# Patient Record
Sex: Female | Born: 1975 | Race: White | Hispanic: No | Smoking: Never smoker
Health system: Southern US, Community
[De-identification: ages and names within clinical notes are randomized; demographics above are authoritative.]

## PROBLEM LIST (undated history)

## (undated) DIAGNOSIS — F429 Obsessive-compulsive disorder, unspecified: Secondary | ICD-10-CM

## (undated) DIAGNOSIS — F419 Anxiety disorder, unspecified: Secondary | ICD-10-CM

## (undated) DIAGNOSIS — F909 Attention-deficit hyperactivity disorder, unspecified type: Secondary | ICD-10-CM

## (undated) DIAGNOSIS — R519 Headache, unspecified: Secondary | ICD-10-CM

## (undated) DIAGNOSIS — F32A Depression, unspecified: Secondary | ICD-10-CM

## (undated) DIAGNOSIS — F411 Generalized anxiety disorder: Secondary | ICD-10-CM

## (undated) DIAGNOSIS — F329 Major depressive disorder, single episode, unspecified: Secondary | ICD-10-CM

## (undated) DIAGNOSIS — N3010 Interstitial cystitis (chronic) without hematuria: Secondary | ICD-10-CM

## (undated) DIAGNOSIS — R51 Headache: Secondary | ICD-10-CM

## (undated) DIAGNOSIS — E559 Vitamin D deficiency, unspecified: Secondary | ICD-10-CM

## (undated) HISTORY — DX: Headache, unspecified: R51.9

## (undated) HISTORY — DX: Headache: R51

## (undated) HISTORY — DX: Attention-deficit hyperactivity disorder, unspecified type: F90.9

## (undated) HISTORY — DX: Vitamin D deficiency, unspecified: E55.9

## (undated) HISTORY — DX: Generalized anxiety disorder: F41.1

---

## 2010-03-07 ENCOUNTER — Inpatient Hospital Stay (HOSPITAL_COMMUNITY): Admission: AD | Admit: 2010-03-07 | Discharge: 2010-03-08 | Payer: Self-pay | Admitting: Obstetrics and Gynecology

## 2010-12-18 LAB — RPR: RPR Ser Ql: NONREACTIVE

## 2010-12-18 LAB — CBC
HCT: 30.7 % — ABNORMAL LOW (ref 36.0–46.0)
MCHC: 35.2 g/dL (ref 30.0–36.0)
MCV: 97 fL (ref 78.0–100.0)
Platelets: 113 10*3/uL — ABNORMAL LOW (ref 150–400)
Platelets: 148 10*3/uL — ABNORMAL LOW (ref 150–400)
RBC: 3.46 MIL/uL — ABNORMAL LOW (ref 3.87–5.11)
RDW: 13.9 % (ref 11.5–15.5)

## 2011-10-18 ENCOUNTER — Encounter (HOSPITAL_COMMUNITY): Payer: Self-pay | Admitting: *Deleted

## 2011-10-18 ENCOUNTER — Inpatient Hospital Stay (HOSPITAL_COMMUNITY)
Admission: AD | Admit: 2011-10-18 | Discharge: 2011-10-22 | DRG: 885 | Disposition: A | Discharge status: Home / Self Care | Payer: PRIVATE HEALTH INSURANCE | Source: Ambulatory Visit | Attending: Psychiatry | Admitting: Psychiatry

## 2011-10-18 ENCOUNTER — Other Ambulatory Visit: Payer: Self-pay

## 2011-10-18 ENCOUNTER — Emergency Department (HOSPITAL_COMMUNITY)
Admission: EM | Admit: 2011-10-18 | Discharge: 2011-10-18 | Disposition: A | Discharge status: Other Acute Inpatient Hospital | Payer: 59 | Attending: Emergency Medicine | Admitting: Emergency Medicine

## 2011-10-18 ENCOUNTER — Encounter (HOSPITAL_COMMUNITY): Payer: Self-pay | Admitting: Emergency Medicine

## 2011-10-18 DIAGNOSIS — T424X4A Poisoning by benzodiazepines, undetermined, initial encounter: Secondary | ICD-10-CM

## 2011-10-18 DIAGNOSIS — F429 Obsessive-compulsive disorder, unspecified: Secondary | ICD-10-CM

## 2011-10-18 DIAGNOSIS — F319 Bipolar disorder, unspecified: Secondary | ICD-10-CM

## 2011-10-18 DIAGNOSIS — F411 Generalized anxiety disorder: Secondary | ICD-10-CM | POA: Insufficient documentation

## 2011-10-18 DIAGNOSIS — F419 Anxiety disorder, unspecified: Secondary | ICD-10-CM

## 2011-10-18 DIAGNOSIS — Z79899 Other long term (current) drug therapy: Secondary | ICD-10-CM | POA: Insufficient documentation

## 2011-10-18 DIAGNOSIS — F19939 Other psychoactive substance use, unspecified with withdrawal, unspecified: Secondary | ICD-10-CM

## 2011-10-18 DIAGNOSIS — T43502A Poisoning by unspecified antipsychotics and neuroleptics, intentional self-harm, initial encounter: Secondary | ICD-10-CM

## 2011-10-18 DIAGNOSIS — F603 Borderline personality disorder: Secondary | ICD-10-CM

## 2011-10-18 DIAGNOSIS — T1491XA Suicide attempt, initial encounter: Secondary | ICD-10-CM

## 2011-10-18 DIAGNOSIS — X789XXA Intentional self-harm by unspecified sharp object, initial encounter: Secondary | ICD-10-CM | POA: Insufficient documentation

## 2011-10-18 DIAGNOSIS — T438X2A Poisoning by other psychotropic drugs, intentional self-harm, initial encounter: Secondary | ICD-10-CM

## 2011-10-18 DIAGNOSIS — F329 Major depressive disorder, single episode, unspecified: Principal | ICD-10-CM

## 2011-10-18 DIAGNOSIS — F332 Major depressive disorder, recurrent severe without psychotic features: Secondary | ICD-10-CM | POA: Insufficient documentation

## 2011-10-18 DIAGNOSIS — F1320 Sedative, hypnotic or anxiolytic dependence, uncomplicated: Secondary | ICD-10-CM

## 2011-10-18 DIAGNOSIS — Z880 Allergy status to penicillin: Secondary | ICD-10-CM

## 2011-10-18 DIAGNOSIS — T43294A Poisoning by other antidepressants, undetermined, initial encounter: Secondary | ICD-10-CM | POA: Insufficient documentation

## 2011-10-18 DIAGNOSIS — S61509A Unspecified open wound of unspecified wrist, initial encounter: Secondary | ICD-10-CM | POA: Insufficient documentation

## 2011-10-18 HISTORY — DX: Depression, unspecified: F32.A

## 2011-10-18 HISTORY — DX: Anxiety disorder, unspecified: F41.9

## 2011-10-18 HISTORY — DX: Major depressive disorder, single episode, unspecified: F32.9

## 2011-10-18 LAB — URINALYSIS, ROUTINE W REFLEX MICROSCOPIC
Glucose, UA: NEGATIVE mg/dL
Hgb urine dipstick: NEGATIVE
Specific Gravity, Urine: 1.012 (ref 1.005–1.030)
pH: 7.5 (ref 5.0–8.0)

## 2011-10-18 LAB — BASIC METABOLIC PANEL
GFR calc Af Amer: >90 mL/min (ref >90)
GFR calc non Af Amer: 85 mL/min — ABNORMAL LOW (ref >90)
Glucose, Bld: 72 mg/dL (ref 70–99)
Potassium: 3 mEq/L — ABNORMAL LOW (ref 3.5–5.1)
Sodium: 139 mEq/L (ref 135–145)

## 2011-10-18 LAB — SALICYLATE LEVEL: Salicylate Lvl: <2 mg/dL — ABNORMAL LOW (ref 2.8–20.0)

## 2011-10-18 LAB — URINE MICROSCOPIC-ADD ON

## 2011-10-18 LAB — URINE CULTURE

## 2011-10-18 LAB — RAPID URINE DRUG SCREEN, HOSP PERFORMED
Cocaine: NOT DETECTED
Opiates: NOT DETECTED
Tetrahydrocannabinol: NOT DETECTED

## 2011-10-18 LAB — ETHANOL: Alcohol, Ethyl (B): <11 mg/dL (ref 0–11)

## 2011-10-18 LAB — CBC
Hemoglobin: 13.9 g/dL (ref 12.0–15.0)
MCHC: 34.8 g/dL (ref 30.0–36.0)
Platelets: 261 10*3/uL (ref 150–400)

## 2011-10-18 LAB — ACETAMINOPHEN LEVEL: Acetaminophen (Tylenol), Serum: <15 ug/mL (ref 10–30)

## 2011-10-18 MED ORDER — MAGNESIUM HYDROXIDE 400 MG/5ML PO SUSP: 30.0000 mL | Freq: Every day | ORAL | Status: DC | PRN

## 2011-10-18 MED ORDER — TRAZODONE HCL 50 MG PO TABS
50.0000 mg | ORAL_TABLET | Freq: Every evening | ORAL | Status: DC | PRN
  Administered 2011-10-19: 50 mg via ORAL
  Filled 2011-10-18: qty 1

## 2011-10-18 MED ORDER — ZOLPIDEM TARTRATE 5 MG PO TABS: 5.0000 mg | ORAL_TABLET | Freq: Every evening | ORAL | Status: DC | PRN

## 2011-10-18 MED ORDER — HYDROXYZINE HCL 25 MG PO TABS: 25.0000 mg | ORAL_TABLET | Freq: Four times a day (QID) | ORAL | Status: DC | PRN

## 2011-10-18 MED ORDER — ALUM & MAG HYDROXIDE-SIMETH 200-200-20 MG/5ML PO SUSP: 30.0000 mL | ORAL | Status: DC | PRN

## 2011-10-18 MED ORDER — VITAMIN B-1 100 MG PO TABS
100.0000 mg | ORAL_TABLET | Freq: Every day | ORAL | Status: DC
Start: 2011-10-19 — End: 2011-10-22
  Administered 2011-10-19 – 2011-10-22 (×4): 100 mg via ORAL
  Filled 2011-10-18 (×5): qty 1

## 2011-10-18 MED ORDER — LOPERAMIDE HCL 2 MG PO CAPS: 2.0000 mg | ORAL_CAPSULE | ORAL | Status: AC | PRN

## 2011-10-18 MED ORDER — POTASSIUM CHLORIDE CRYS ER 20 MEQ PO TBCR
40.0000 meq | EXTENDED_RELEASE_TABLET | Freq: Once | ORAL | Status: AC
  Administered 2011-10-18: 40 meq via ORAL
  Filled 2011-10-18: qty 2

## 2011-10-18 MED ORDER — TETANUS-DIPHTH-ACELL PERTUSSIS 5-2.5-18.5 LF-MCG/0.5 IM SUSP
0.5000 mL | Freq: Once | INTRAMUSCULAR | Status: AC
  Administered 2011-10-18: 0.5 mL via INTRAMUSCULAR
  Filled 2011-10-18: qty 0.5

## 2011-10-18 MED ORDER — THIAMINE HCL 100 MG/ML IJ SOLN: 100.0000 mg | Freq: Once | INTRAMUSCULAR | Status: DC

## 2011-10-18 MED ORDER — ONDANSETRON HCL 4 MG PO TABS: 4.0000 mg | ORAL_TABLET | Freq: Three times a day (TID) | ORAL | Status: DC | PRN

## 2011-10-18 MED ORDER — SODIUM CHLORIDE 0.9 % IV BOLUS (SEPSIS)
1000.0000 mL | Freq: Once | INTRAVENOUS | Status: AC
  Administered 2011-10-18: 1000 mL via INTRAVENOUS

## 2011-10-18 MED ORDER — DESVENLAFAXINE SUCCINATE ER 50 MG PO TB24: 50.0000 mg | ORAL_TABLET | Freq: Every day | ORAL | Status: DC

## 2011-10-18 MED ORDER — ONDANSETRON 4 MG PO TBDP: 4.0000 mg | ORAL_TABLET | Freq: Four times a day (QID) | ORAL | Status: AC | PRN

## 2011-10-18 MED ORDER — CLONAZEPAM 1 MG PO TABS
1.0000 mg | ORAL_TABLET | Freq: Four times a day (QID) | ORAL | Status: DC
  Administered 2011-10-18: 1 mg via ORAL
  Filled 2011-10-18: qty 1

## 2011-10-18 MED ORDER — CHLORDIAZEPOXIDE HCL 25 MG PO CAPS
25.0000 mg | ORAL_CAPSULE | Freq: Four times a day (QID) | ORAL | Status: DC | PRN
  Filled 2011-10-18: qty 2

## 2011-10-18 MED ORDER — LORAZEPAM 1 MG PO TABS: 1.0000 mg | ORAL_TABLET | Freq: Three times a day (TID) | ORAL | Status: DC | PRN

## 2011-10-18 MED ORDER — CHLORDIAZEPOXIDE HCL 25 MG PO CAPS: 25.0000 mg | ORAL_CAPSULE | Freq: Three times a day (TID) | ORAL | Status: DC

## 2011-10-18 MED ORDER — CHLORDIAZEPOXIDE HCL 25 MG PO CAPS
25.0000 mg | ORAL_CAPSULE | Freq: Four times a day (QID) | ORAL | Status: DC
  Administered 2011-10-18: 25 mg via ORAL
  Filled 2011-10-18: qty 1

## 2011-10-18 MED ORDER — ACETAMINOPHEN 325 MG PO TABS: 650.0000 mg | ORAL_TABLET | Freq: Four times a day (QID) | ORAL | Status: DC | PRN

## 2011-10-18 MED ORDER — CHLORDIAZEPOXIDE HCL 25 MG PO CAPS: 25.0000 mg | ORAL_CAPSULE | Freq: Every day | ORAL | Status: DC

## 2011-10-18 MED ORDER — ADULT MULTIVITAMIN W/MINERALS CH
1.0000 | ORAL_TABLET | Freq: Every day | ORAL | Status: DC
Start: 2011-10-19 — End: 2011-10-22
  Administered 2011-10-19 – 2011-10-22 (×4): 1 via ORAL
  Filled 2011-10-18 (×4): qty 1

## 2011-10-18 MED ORDER — CHLORDIAZEPOXIDE HCL 25 MG PO CAPS: 25.0000 mg | ORAL_CAPSULE | ORAL | Status: DC

## 2011-10-18 MED ORDER — CHLORDIAZEPOXIDE HCL 25 MG PO CAPS
25.0000 mg | ORAL_CAPSULE | Freq: Once | ORAL | Status: AC
  Administered 2011-10-18: 25 mg via ORAL

## 2011-10-18 MED ORDER — IBUPROFEN 600 MG PO TABS: 600.0000 mg | ORAL_TABLET | Freq: Three times a day (TID) | ORAL | Status: DC | PRN

## 2011-10-18 MED ORDER — TRAZODONE HCL 50 MG PO TABS: 50.0000 mg | ORAL_TABLET | Freq: Every day | ORAL | Status: DC

## 2011-10-18 NOTE — BH Assessment (Addendum)
Assessment Note   Diana Goodman is an 36 y.o. female. Patient with hx of depression and anxiety reports increased depression over the past 5 days. States she is sleeping a lot;up to 12 hours per night. She feels very lonely, worses at night after her spouse leaves for work. She is also very anxious sts that sometimes "it's a unbearable feeling". States psychiatrist Dr Evelene Croon has changed her medications from zoloft after taking this medication for 10 yrs to Wellbutrin. She was later placed on Ritalin. She has not been able to fill the wellbutrin. Ritalin was added because she has been sleeping so much.   States that last night at 1:00am, she took the rest of her klonopin (estimated between 30-80 pills) because she "wanted to get loose enough where I could cut my wrist," left her husband a note, got in her car, and attempted to cut her wrist with a butcher knife. Denies taking any other pills. When asked about whether she still feels like killing herself she states "not right now, I might tomorrow." Patient denies current suicidal ideations and is requesting to go home so thay she may spend time with her kids.States that physically she feels fine but sleepy. Patient asking to be re-assessed later so that she may sleep and get rest. Patient makes the point that she has not felt her self since having her daughter approx. 18 months ago. Pt explains that the trigger for her depression and anxiety is related to a hormonal issues that formed after childbirth.   Denies HI, ETOH or drug abuse.   Pt sts that she does not want in-pt treatment and is requesting to go home so that she may be with her children. Pt agreeable to completing a telepsych with the understanding that this will assist with dispositions and providing her with the most appropriate care needed.    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 Depression   . Anxiety    Axis IV: other psychosocial or environmental problems Axis V: 31-40 impairment in reality testing  Past Medical History:  Past Medical History  Diagnosis Date  . Depression   . Anxiety     History reviewed. No pertinent past surgical history.  Family History: History reviewed. No pertinent family history.  Social History:  does not have a smoking history on file. She does not have any smokeless tobacco history on file. Her alcohol and drug histories not on file.  Additional Social History:  Alcohol / Drug Use Pain Medications: none reported Prescriptions: Prozac, Welbutrin, Klonopin, Ritalin.  Over the Counter: none reported History of alcohol / drug use?: No history of alcohol / drug abuse Longest period of sobriety (when/how long): n/a Allergies:  Allergies  Allergen Reactions  . Penicillins     Home Medications:  Medications Prior to Admission  Medication Dose Route Frequency Provider Last Rate Last Dose  . alum & mag hydroxide-simeth (MAALOX/MYLANTA) 200-200-20 MG/5ML suspension 30 mL  30 mL Oral PRN Rise Patience, PA      . ibuprofen (ADVIL,MOTRIN) tablet 600 mg  600 mg Oral Q8H PRN Rise Patience, PA      . LORazepam (ATIVAN) tablet 1 mg  1 mg Oral Q8H PRN Rise Patience, PA      . ondansetron [facility]) tablet 4 mg  4 mg Oral Q8H PRN Rise Patience, PA      .  potassium chloride SA (K-DUR,KLOR-CON) CR tablet 40 mEq  40 mEq Oral Once Rise Patience, PA   40 mEq at 10/18/11 0855  . sodium chloride 0.9 % bolus 1,000 mL  1,000 mL Intravenous Once Sunnie Nielsen, MD   1,000 mL at 10/18/11 0400  . TDaP (BOOSTRIX) injection 0.5 mL  0.5 mL Intramuscular Once Rise Patience, PA   0.5 mL at 10/18/11 0856  . zolpidem (AMBIEN) tablet 5 mg  5 mg Oral QHS PRN Rise Patience, PA       No current outpatient prescriptions on file as of 10/18/2011.    OB/GYN Status:  No LMP recorded.  General Assessment Data Location of Assessment: [facility] ACT  Assessment:  (no) Living Arrangements:  (lives with spouse and 2 children) Can pt return to current living arrangement?: Yes Admission Status: Voluntary Is patient capable of signing voluntary admission?: Yes Transfer from: Acute Hospital Referral Source: Self/Family/Friend     Risk to self Suicidal Ideation: Yes-Currently Present Suicidal Intent: No Is patient at risk for suicide?: Yes Suicidal Plan?: No (no current plan;sts she OD'd taking 30-80pills last night) Access to Means: Yes Specify Access to Suicidal Means:  (prescription medications) What has been your use of drugs/alcohol within the last 12 months?:  (pt denies alcohol and drug use) Previous Attempts/Gestures: No How many times?:  (n/a;pt denies previous suicide attempts and/or gestures) Other Self Harm Risks:  (n/a) Triggers for Past Attempts: Other (Comment) (no previous attempts) Intentional Self Injurious Behavior: None Family Suicide History:  ("mother and father both have issues with depression") Recent stressful life event(s): Other (Comment) (caring for daughter sts,"she is a handful & I can't rest") Persecutory voices/beliefs?: No Depression: Yes Depression Symptoms: Despondent;Tearfulness;Insomnia;Isolating;Fatigue;Loss of interest in usual pleasures;Feeling worthless/self pity;Feeling angry/irritable Substance abuse history and/or treatment for substance abuse?: Yes Suicide prevention information given to non-admitted patients: Yes  Risk to Others Homicidal Ideation: No Thoughts of Harm to Others: No Current Homicidal Intent: No Current Homicidal Plan: No Access to Homicidal Means: No Identified Victim:  (n/a) History of harm to others?: No Assessment of Violence: None Noted Violent Behavior Description:  (n/a) Does patient have access to weapons?: No Criminal Charges Pending?: No Does patient have a court date: No  Psychosis Hallucinations: None noted Delusions: None noted  Mental Status  Report Appear/Hygiene: Disheveled Eye Contact: Fair Motor Activity: Unremarkable Speech: Logical/coherent Level of Consciousness: Alert Mood: Depressed;Anxious Affect: Anxious;Depressed Anxiety Level: None Thought Processes: Coherent;Relevant Judgement: Impaired Orientation: Person;Time;Place;Situation Obsessive Compulsive Thoughts/Behaviors: None  Cognitive Functioning Concentration: Decreased Memory: Recent Intact;Remote Intact IQ: Average Insight: Poor Impulse Control: Poor Appetite: Poor Weight Loss:  (20 Ibs in 2 weeks) Weight Gain:  (n/a) Sleep: Decreased Total Hours of Sleep:  (approx. 12 hrs (3am-3pm)) Vegetative Symptoms: None  Prior Inpatient Therapy Prior Inpatient Therapy: No Prior Therapy Dates:  (n/a) Prior Therapy Facilty/Provider(s):  (n/a) Reason for Treatment:  (n/a)  Prior Outpatient Therapy Prior Outpatient Therapy: Yes Prior Therapy Dates:  (current) Prior Therapy Facilty/Provider(s):  (Dr. Evelene Croon) Reason for Treatment:  (medication manangement)  ADL Screening (condition at time of admission) Patient's cognitive ability adequate to safely complete daily activities?: Yes Patient able to express need for assistance with ADLs?: Yes Independently performs ADLs?: Yes Weakness of Legs: None Weakness of Arms/Hands: None  Home Assistive Devices/Equipment Home Assistive Devices/Equipment: None    Abuse/Neglect Assessment (Assessment to be complete while patient is alone) Physical Abuse: Denies Verbal Abuse: Denies Sexual Abuse: Denies Exploitation of patient/patient's resources: Denies Self-Neglect: Denies Values /  Beliefs Cultural Requests During Hospitalization: None Spiritual Requests During Hospitalization: None Consults Spiritual Care Consult Needed: No Social Work Consult Needed: No Merchant navy officer (For Healthcare) Advance Directive: Patient does not have advance directive Nutrition Screen Unintentional weight loss greater than 10lbs  within the last month: Yes (Comment) (sts she has lost 20 Ibs in the past 2 weeks. ) Dysphagia: No Home Tube Feeding or Total Parenteral Nutrition (TPN): No Patient appears severely malnourished: No Pregnant or Lactating: No Dietitian Consult Needed: No  Additional Information 1:1 In Past 12 Months?: No CIRT Risk: No Elopement Risk: No Does patient have medical clearance?: Yes     Disposition:  Patients disposition is pending a telepsych consult at this time.   On Site Evaluation by:   Reviewed with Physician:     Melynda Ripple [facility] 10/18/2011 3:13 PM

## 2011-10-18 NOTE — Tx Team (Signed)
Initial Interdisciplinary Treatment Plan  PATIENT STRENGTHS: (choose at least two) Ability for insight Average or above average intelligence Capable of independent living Communication skills General fund of knowledge Motivation for treatment/growth Supportive family/friends  PATIENT STRESSORS: Medication change or noncompliance Substance abuse   PROBLEM LIST: Problem List/Patient Goals Date to be addressed Date deferred Reason deferred Estimated date of resolution                                                         DISCHARGE CRITERIA:  Ability to meet basic life and health needs Improved stabilization in mood, thinking, and/or behavior Motivation to continue treatment in a less acute level of care Need for constant or close observation no longer present Reduction of life-threatening or endangering symptoms to within safe limits Verbal commitment to aftercare and medication compliance  PRELIMINARY DISCHARGE PLAN: Attend aftercare/continuing care group Return to previous living arrangement  PATIENT/FAMIILY INVOLVEMENT: This treatment plan has been presented to and reviewed with the patient, BREON DISS, and/or family member,  .  The patient and family have been given the opportunity to ask questions and make suggestions.  Hoover Browns 10/18/2011, 9:33 PM

## 2011-10-18 NOTE — ED Notes (Signed)
MD at bedside. 
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
## 2011-10-18 NOTE — ED Notes (Signed)
Pt completed tele-psych consult which recommended inpatient psychiatric admission and provided medication recommendations. EDP notified of tele-psych consult recommendations. Pt currently under review at [facility]. CSW/ACT to follow up with disposition.

## 2011-10-18 NOTE — ED Notes (Signed)
Pt wanded °

## 2011-10-18 NOTE — ED Provider Notes (Addendum)
telepsych consult completed.  I ordered meds suggested by the psychiatrist.  Nicholes Stairs, MD 10/18/11 1854  The patient was accepted at behavioral health by [facility].  Nicholes Stairs, MD 10/18/11 2014

## 2011-10-18 NOTE — ED Notes (Signed)
Patient is resting comfortably. 

## 2011-10-18 NOTE — ED Provider Notes (Signed)
History     CSN: [id number]  Arrival date & time 10/18/11  1324   First MD Initiated Contact with Patient 10/18/11 [phone]      Chief Complaint  Patient presents with  . Drug Overdose    15-20mg  of clonapin ingestion at midnight    (Consider location/radiation/quality/duration/timing/severity/associated sxs/prior treatment) HPI Comments: Patient with hx of depression and anxiety reports increased depression over the past 5 days.  States she is sleeping a lot and feels very lonely, worse at night.  She is also very anxious.  States psychiatrist Dr Evelene Croon has changed her medications from zoloft to wellbutrin and has added ritalin.  States she has not been able to fill the wellbutrin.  Ritalin was added because she has been sleeping so much.  States that last night at 1:00am, she took the rest of her klonopin (estimated between 30-80 pills) because she "wanted to get loose enough where I could cut my wrist," left her husband a note, got in her car, and attempted to cut her wrist with a butcher knife.  Denies taking any other pills.  When asked about whether she still feels like killing herself she states "not right now, I might tomorrow."   States that physically she feels fine but sleepy.  Denies HI, ETOH or drug abuse.   Patient is a 36 y.o. female presenting with Overdose. The history is provided by the patient.  Drug Overdose  Drug Overdose    Past Medical History  Diagnosis Date  . Depression     No past surgical history on file.  No family history on file.  History  Substance Use Topics  . Smoking status: Not on file  . Smokeless tobacco: Not on file  . Alcohol Use:     OB History    Grav Para Term Preterm Abortions TAB SAB Ect Mult Living                  Review of Systems  Genitourinary: Negative for dysuria, urgency and frequency.  All other systems reviewed and are negative.    Allergies  Penicillins  Home Medications   Current Outpatient Rx  Name Route  Sig Dispense Refill  . BUPROPION HCL ER (XL) 300 MG PO TB24 Oral Take 150 mg by mouth 2 (two) times daily.    Marland Kitchen CLONAZEPAM 2 MG PO TABS Oral Take 1 mg by mouth 4 (four) times daily.    Marland Kitchen FLUOXETINE HCL 40 MG PO CAPS Oral Take 40 mg by mouth daily.    . METHYLPHENIDATE HCL 10 MG PO TABS Oral Take 20 mg by mouth daily.    Marland Kitchen VITAMIN D (ERGOCALCIFEROL) 50000 UNITS PO CAPS Oral Take 50,000 Units by mouth every 7 (seven) days.      BP 105/72  Pulse 78  Temp(Src) 98 F (36.7 C) (Oral)  Resp 16  Ht 5\' 4"  (1.626 m)  Wt 120 lb (54.432 kg)  BMI 20.60 kg/m2  SpO2 100%  Physical Exam  Nursing note and vitals reviewed. Constitutional: She is oriented to person, place, and time. She appears well-developed and well-nourished.  HENT:  Head: Normocephalic and atraumatic.  Neck: Neck supple.  Cardiovascular: Normal rate, regular rhythm and normal heart sounds.   Pulmonary/Chest: Breath sounds normal. No respiratory distress. She has no wheezes. She has no rales. She exhibits no tenderness.  Abdominal: Soft. Bowel sounds are normal. She exhibits no distension and no mass. There is no tenderness. There is no rebound and no guarding.  Neurological: She is alert and oriented to person, place, and time.  Skin:       Left wrist with several superficial lacerations.  Wounds are hemostatic.  Very superficial, scabbed over.  No need for repair.  No erythema, edema, warmth.      ED Course  Procedures (including critical care time)  Labs Reviewed  BASIC METABOLIC PANEL - Abnormal; Notable for the following:    Potassium 3.0 (*)    GFR calc non Af Amer 85 (*)    All other components within normal limits  URINE RAPID DRUG SCREEN (HOSP PERFORMED) - Abnormal; Notable for the following:    Benzodiazepines POSITIVE (*)    Amphetamines POSITIVE (*)    All other components within normal limits  URINALYSIS, ROUTINE W REFLEX MICROSCOPIC - Abnormal; Notable for the following:    Leukocytes, UA MODERATE (*)     All other components within normal limits  URINE MICROSCOPIC-ADD ON - Abnormal; Notable for the following:    Squamous Epithelial / LPF FEW (*)    Bacteria, UA FEW (*)    All other components within normal limits  SALICYLATE LEVEL - Abnormal; Notable for the following:    Salicylate Lvl <2.0 (*)    All other components within normal limits  CBC  ETHANOL  PREGNANCY, URINE  ACETAMINOPHEN LEVEL  URINE CULTURE   No results found.  6:57 AM Patient seen and examined, discussed with Dr Dierdre Highman who agrees with moving patient to psych ED.  6 hours following overdose, patient is alert and oriented, speaking clearly in complete sentences.  EKG is normal.    Date: 10/18/2011  Rate: 67  Rhythm: normal sinus rhythm  QRS Axis: normal  Intervals: normal  ST/T Wave abnormalities: normal  Conduction Disutrbances:none  Narrative Interpretation:   Old EKG Reviewed: none available  9:15 AM Discussed patient with [place], ACT Team, who will see and assess patient for placement.    1. Depression   2. Suicide attempt       MDM  Patient with hx depression and anxiety with worsening depression over 5 days.  Last night overdosed on klonopin in attempt to "loosen up" enough to kill herself.  Patient wrote a note, got in her car, and cut her left wrist with a butcher knife.  Patient with very superficial lacerations to wrist, no need for repair.  Tetanus vx given.  ACT to assess for placement.          Dillard Cannon Hust[place], [place] 10/18/11 4098  Medical screening examination/treatment/procedure(s) were performed by non-physician practitioner and as supervising physician I was immediately available for consultation/collaboration.  Sunnie Nielsen, MD 10/19/11 [phone]

## 2011-10-18 NOTE — ED Notes (Signed)
[id number]<AV> Expected date:<BR> Expected time:<BR> Means of arrival:<BR> Comments:<BR> EMS/took overdose Klonopin-?15-suicidal

## 2011-10-18 NOTE — BH Assessment (Signed)
Patient is pending a telepsych at this time.

## 2011-10-19 MED ORDER — CHLORDIAZEPOXIDE HCL 25 MG PO CAPS
25.0000 mg | ORAL_CAPSULE | Freq: Every day | ORAL | Status: AC
  Administered 2011-10-21: 25 mg via ORAL
  Filled 2011-10-19: qty 1

## 2011-10-19 MED ORDER — FLUOXETINE HCL 20 MG PO CAPS
40.0000 mg | ORAL_CAPSULE | Freq: Every day | ORAL | Status: DC
  Administered 2011-10-19 – 2011-10-22 (×4): 40 mg via ORAL
  Filled 2011-10-19 (×3): qty 2
  Filled 2011-10-19: qty 1
  Filled 2011-10-19: qty 2
  Filled 2011-10-19: qty 1

## 2011-10-19 MED ORDER — CHLORDIAZEPOXIDE HCL 25 MG PO CAPS
25.0000 mg | ORAL_CAPSULE | Freq: Two times a day (BID) | ORAL | Status: AC
  Administered 2011-10-20 (×2): 25 mg via ORAL
  Filled 2011-10-19 (×2): qty 1

## 2011-10-19 NOTE — [facility] Suicide Risk Assessment (Signed)
Suicide Risk Assessment  Admission Assessment     Demographic factors:  Assessment Details Time of Assessment: Admission  Current Mental Status:  S/p klonopin OD, sig sedated.  Patient seen and evaluated. Chart reviewed. Patient stated that her mood was "ok". Her affect was mood congruent, yet sedated.  She denied any current thoughts of self injurious behavior, suicidal ideation or homicidal ideation. There were no auditory or visual hallucinations, paranoia, delusional thought processes, or mania noted.  Thought process was linear and goal directed.  Psychomotor retardation noted. Speech was normal rate, tone and volume. Eye contact was good. Judgment and insight are limited.     Loss Factors: son with autism   Historical Factors:  Historical Factors: Family history of suicide;Family history of mental illness or substance abuse; s/p OD; Hx sig OCD  Risk Reduction Factors:  Risk Reduction Factors: Sense of responsibility to family;Living with another person, especially a relative;Positive social support;Responsible for children under 73 years of age  CLINICAL FACTORS: Anxiety Disorder NOS; Hx reported OCD and MDD; r/o BPD; Benzo W/D  COGNITIVE FEATURES THAT CONTRIBUTE TO RISK: limited coping skills   SUICIDE RISK: Pt viewed as a chronic increased risk of harm to self in light of her past hx and risk factors.  No acute safety concerns on the unit.  Pt contracting for safety and in need of crisis stabilization & Tx.  Meds:   . chlordiazePOXIDE  25 mg Oral Once  . chlordiazePOXIDE  25 mg Oral BID  . chlordiazePOXIDE  25 mg Oral QHS  . FLUoxetine  40 mg Oral Daily  . mulitivitamin with minerals  1 tablet Oral Daily  . thiamine  100 mg Intramuscular Once  . thiamine  100 mg Oral Daily  . DISCONTD: chlordiazePOXIDE  25 mg Oral QID  . DISCONTD: chlordiazePOXIDE  25 mg Oral TID  . DISCONTD: chlordiazePOXIDE  25 mg Oral BH-qamhs  . DISCONTD: chlordiazePOXIDE  25 mg Oral Daily    PLAN OF  CARE: Discontinued librium taper in light of OD and sig sedation/hypotension.  Started bid dosing tomorrow with once Sunday.  Restarted Prozac.  Holding Wellbutrin for now and do not plan to restart Ritalin.  Medication education completed.  Pros, cons, risks, potential side effects and benefits (including no treatment) were discussed with pt.  Pt admitted for crisis stabilization and treatment. Will continue q15 minute checks per unit protocol.  No clinical indication for one on one level of observation at this time.  Pt contracting for safety.  Mental health treatment, medication management and sobriety will mitigate against the increased risk of harm to self and/or others.  Pt agreeable with the plan.  Discussed with the team.   Lupe Carney 10/19/2011, 5:01 PM

## 2011-10-19 NOTE — Discharge Planning (Signed)
New patient attended AM group.  States she is here due to medication changes that have left her depressed and anxious to the point she felt like she couldn't go on.  Precipitant appears to be discontinuation of Wellbutrin as she was no longer able to afford it.  Today she denies SI, feels this is not where she needs to be.  I assured her she would see our doctor today who could speak with her about options for medications.  She sees Dr Evelene Croon for meds.

## 2011-10-19 NOTE — Progress Notes (Signed)
Pt has been started on the librium protocol with 25mg  loading dose and 25mg  night dose. Pt is getting along with roommate. Pt is only experiencing some anxiety in relation to withdrawal symptoms. Pt has been orientated to the units morning regimen. Pt safety remains with q60min checks.
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
## 2011-10-19 NOTE — Progress Notes (Addendum)
Pt automated BP 68/36. Manual 98/58. Escorted to dayroom and will bring lunch meal to her. 2 snacks given with 300cc gatorade.Will reassess at 1600. Instructed pt to rise slowly from seated/lying position. Verbalized understanding.

## 2011-10-19 NOTE — Progress Notes (Signed)
[facility] Group Notes:  (Counselor/Nursing/MHT/Case Management/Adjunct)  10/19/2011 12:53 PM  Type of Therapy:  Group Therapy  Participation Level:  Did Not Attend Vanetta Mulders, LPCA    York Valliant Garret Reddish 10/19/2011, 12:53 PM

## 2011-10-19 NOTE — Progress Notes (Signed)
[facility] Group Notes:  (Counselor/Nursing/MHT/Case Management/Adjunct)  10/19/2011 2:58 PM  Type of Therapy:  Group Therapy  Participation Level:  Did Not Attend  Vanetta Mulders, LPCA    Diesel Lina Garret Reddish 10/19/2011, 2:58 PM

## 2011-10-19 NOTE — Progress Notes (Signed)
Pt depressed and irritable this am demanding immediate d/c. States she is "more depressed now than before" and that her husband will take of her. Explained admission procedure and offered 72 hour release for discharge. Denies SI. Encouraged completion of patient inventory sheet and group attendance. Support and encouragement given. AM meds held due to low BP and "feeling dizzy". Did not walk down to breakfast. Cont 15' checks for safety.

## 2011-10-20 DIAGNOSIS — F1920 Other psychoactive substance dependence, uncomplicated: Secondary | ICD-10-CM

## 2011-10-20 NOTE — Progress Notes (Signed)
Pt has been in bed for most of shift, did not attend wrap up group.  Pt having some withdrawal from Klonopin, but received Librium around 1800 and was in bed from then on.  No complaints voiced, denies SI/HI/hallucinatinos.  Will continue to monitor.

## 2011-10-20 NOTE — Progress Notes (Signed)
[facility] MD Progress Note  10/20/2011 3:12 PM  Diagnosis:   Axis I: See current hospital problem list Axis II: Deferred Axis III:  Past Medical History  Diagnosis Date  . Depression   . Anxiety    Axis IV: Unchanged Axis V: 41-50 serious symptoms  ADL's:  Intact  Sleep:  Yes,  AEB:  Appetite:  Yes,  AEB:  Suicidal Ideation:   Plan:  No  Intent:  No  Means:  No  Homicidal Ideation:   Plan:  No  Intent:  No  Means:  No  AEB (as evidenced by):Diana Goodman reports her anxiety is well managed while she is detoxing from Klonopin.  The Librium is making her very sleepy.  She is interested in possible other medications to help with her anxiety.  She had been on Zoloft for many years, before it was changed to Prozac.  She also endorses a desire to change psychiatrists, as her current provider over-medicated her.  Mental Status: General Appearance Diana Goodman:  Disheveled Eye Contact:  Good Motor Behavior:  Normal Speech:  Normal Level of Consciousness:  Alert Mood:  Depressed Affect:  Appropriate Anxiety Level:  Minimal Thought Process:  Coherent and Relevant Thought Content:  WNL Perception:  Normal Judgment:  Good Insight:  Present Cognition:  Orientation time, place and person Memory Remote Concentration Yes Sleep:  Number of Hours: 5.75   Vital Signs:Blood pressure 88/57, pulse 80, temperature 98 F (36.7 C), temperature source Oral, resp. rate 18.  Lab Results: No results found for this or any previous visit (from the past 48 hour(s)).  Physical Findings: AIMS:  , ,  ,  ,    CIWA:  CIWA-Ar Total: 2  COWS:     Treatment Plan Summary: Daily contact with patient to assess and evaluate symptoms and progress in treatment Medication management  Plan: Consider BuSpar or another SSRI to target anxiety.  Refer to a new psychiatrist per pt request. Diana Goodman 10/20/2011, 3:12 PM

## 2011-10-20 NOTE — Progress Notes (Signed)
Pt. Is in bed resting quietly with her eyes closed 

## 2011-10-20 NOTE — Progress Notes (Signed)
Patient ID: Diana Goodman, female   DOB: [birth year], 36 y.o.   MRN: [id number]   [facility] Group Notes:  (Counselor/Nursing/MHT/Case Management/Adjunct)  10/20/2011 1:15 PM  Type of Therapy:  Group Therapy, Dance/Movement Therapy   Participation Level:  Active  Participation Quality:  Appropriate  Affect:  Flat  Cognitive:  Oriented  Insight:  Limited  Engagement in Group:  Good  Engagement in Therapy:  Good  Modes of Intervention:  Clarification, Problem-solving, Role-play, Socialization and Support  Summary of Progress/Problems:   Pt shared that she feels like a lion when she is angry. Group conversed about how to deal with negative self-talk and supports who are negative by instead using health, positive coping skills. Pt disclosed how she wants to be "normal," but she believes that she has never experienced this because of her anxiety and depression. She also named that her suicide attempt was in order to be admitted to [facility] for help. Pts practiced a full body, breathing technique that encourages them to inhale positive energy and to exhale negative energy. Pts agreed to use this technique during quiet time today.  Thomasena Edis, [employer]
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
## 2011-10-20 NOTE — Progress Notes (Signed)
Pt has been more visible on the unit this shift,attending am and afternoon groups. Has minimal insight into SA issues and focused on shopping addiction saying she felt that was her "main problem".  Seclusive to room and redirected to group activities.   Denies SI.  C/O fatigue and reassured that it was part of detox.  Rates depression and hopelessness at, however, affect does not match stated mood.  Cont to offer support and encouragement and cont 15' checks for safety.

## 2011-10-21 NOTE — Progress Notes (Signed)
[facility] Group Notes:  (Counselor/Nursing/MHT/Case Management/Adjunct)  10/21/2011 1315PM  Type of Therapy:  Group Therapy  Participation Level:  Active  Participation Quality:  Appropriate  Affect:  Appropriate  Cognitive:  Appropriate  Insight:  Good  Engagement in Group:  Good  Engagement in Therapy:  Good  Modes of Intervention:  Activity, Clarification, Problem-solving and Support  Summary of Progress/Problems:  Pt. participated in group session on supports. Pt. was asked what support means to them, who are their supports, what is the difference between unhealthy and healthy supports and what they can do when their support is not there. PT. Identified her husband, mother and father as supports in her life. Pt. Stated that her dad is the most supportive because understands her more because he is a recovering alcoholic.   Moira Umholtz 10/21/2011, 2:43 PM
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
## 2011-10-21 NOTE — Progress Notes (Signed)
Pt still seems resistant to admitting that she has a problem with benzos, husband is very concerned however and wants Pt to get treatment.  Pt has attended more groups today and went to the cafeteria appropriately.  Pt denies SI/HI/hallucinations.  Pt has been overwhelmed at home with her two small children who are 4 and 48 months old.  Husband is now overwhelmed that Pt is not there.  Support and encouragement offered, will continue to monitor.

## 2011-10-21 NOTE — Progress Notes (Signed)
[facility] MD Progress Note  10/21/2011 2:49 PM  Diagnosis:   Axis I: See current hospital problem list Axis II: Deferred Axis III:  Past Medical History  Diagnosis Date  . Depression   . Anxiety    Axis IV: Unchanged Axis V: 51-60 moderate symptoms  ADL's:  Intact  Sleep:  Yes,  AEB:  Appetite:  Yes,  AEB:  Suicidal Ideation:   Plan:  No  Intent:  No  Means:  No  Homicidal Ideation:   Plan:  No  Intent:  No  Means:  No  AEB (as evidenced by):Diana Goodman reports her anxiety is less today, and feels that is, in part, because she is getting a respite from her 27 yo son, and 16 month-old daughter.  Mental Status: General Appearance Diana Goodman:  Disheveled Eye Contact:  Good Motor Behavior:  Normal Speech:  Normal Level of Consciousness:  Alert Mood:  Euthymic Affect:  Appropriate Anxiety Level:  Minimal Thought Process:  Coherent Thought Content:  WNL Perception:  Normal Judgment:  Good Insight:  Present Cognition:  Orientation time, place and person Memory Remote Concentration Yes Sleep:  Number of Hours: 6   Vital Signs:Blood pressure 89/60, pulse 79, temperature 97 F (36.1 C), temperature source Oral, resp. rate 16.  Lab Results: No results found for this or any previous visit (from the past 48 hour(s)).  Physical Findings: AIMS:  , ,  ,  ,    CIWA:  CIWA-Ar Total: 6  COWS:     Treatment Plan Summary: Daily contact with patient to assess and evaluate symptoms and progress in treatment Medication management  Plan: Continue current treatment plan.  Consider discharge tomorrow. Diana Goodman 10/21/2011, 2:49 PM

## 2011-10-21 NOTE — Progress Notes (Signed)
[facility] Adult Inpatient Family/Significant Other Suicide Prevention Education  Suicide Prevention Education:  Education Completed; Windell Hummingbird, [phone]  (name of family member/significant other) has been identified by the patient as the family member/significant other with whom the patient will be residing, and identified as the person(s) who will aid the patient in the event of a mental health crisis (suicidal ideations/suicide attempt).  With written consent from the patient, the family member/significant other has been provided the following suicide prevention education, prior to the and/or following the discharge of the patient.  The suicide prevention education provided includes the following:  Suicide risk factors  Suicide prevention and interventions  National Suicide Hotline telephone number  [facility] assessment telephone number  [facility] Emergency Assistance 911  [facility] and/or Residential Mobile Crisis Unit telephone number  Request made of family/significant other to:  Remove weapons (e.g., guns, rifles, knives), all items previously/currently identified as safety concern.    Remove drugs/medications (over-the-counter, prescriptions, illicit drugs), all items previously/currently identified as a safety concern.  The family member/significant other verbalizes understanding of the suicide prevention education information provided.  The family member/significant other agrees to remove the items of safety concern listed above.  Pt. accepted information on suicide prevention, warning signs to look for with suicide and crisis line numbers to use. The pt. agreed to call crisis line numbers if having warning signs or having thoughts of suicide.    [facility] 10/21/2011, 10:45 AM

## 2011-10-21 NOTE — Progress Notes (Signed)
Pt was initially resistant to leaving room this am. Firm but compassionate confrontation redirection and suggestions offered concerning goals and expectations for inpatient stay.  Rates depression and hopelessness at 1,although affect is flat. Facial expressions brighter that previous assessments.  Cont to c/o decreased appetite and small frequent meals encouraged with 4 servings of protein daily. No physical complaints. Attending groups. Denies SI. Ongoing support and 15' checks cont for safety.

## 2011-10-22 DIAGNOSIS — F411 Generalized anxiety disorder: Secondary | ICD-10-CM

## 2011-10-22 MED ORDER — FLUOXETINE HCL 40 MG PO CAPS: 40.0000 mg | ORAL_CAPSULE | Freq: Every day | ORAL | Status: DC

## 2011-10-22 NOTE — Progress Notes (Signed)
Patient ID: Diana Goodman, female   DOB: 10-Mar-1976, 36 y.o.   MRN: [id number] Nursing     PT.is med compliant and attending groups.She rates her Hopelessness and Depression as 1/10.She denies S/I,HI and A/V Hallucinations and is happily awaiting Discharge Today.

## 2011-10-22 NOTE — Progress Notes (Signed)
[facility] Case Management Discharge Plan:  Will you be returning to the same living situation after discharge: Yes,  home At discharge, do you have transportation home?:Yes,  husband Do you have the ability to pay for your medications:Yes,  insurance  Interagency Information:     Release of information consent forms completed and in the chart;  Patient's signature needed at discharge.  Patient to Follow up at:  Follow-up Information    Follow up with Dr Evelene Croon on 10/30/2011. (9:30 AM  You may cancel this appointment if you find another provider)    Contact information:   [address]  [place]  [phone]      Follow up with Other options:  [facility] can probably get you in quickly  [phone]  . ([facility] will schedule an appointment if you give them a credit card # to hold an appointment  )       Follow up with [facility], NP.   Contact information:   [address] [facility] [place] [address] [phone]          Patient denies SI/HI:   Yes,  yes    Safety Planning and Suicide Prevention discussed:  Yes,  yes  Barrier to discharge identified:No.  Summary and Recommendations:   Diana Goodman 10/22/2011, 11:15 AM

## 2011-10-22 NOTE — Treatment Plan (Signed)
Interdisciplinary Treatment Plan Update (Adult)  Date: 10/22/2011  Time Reviewed: 8:07 AM   Progress in Treatment: Attending groups: Yes Participating in groups: Yes Taking medication as prescribed: Yes Tolerating medication: Yes   Family/Significant other contact made: Yes for suicide prevention  Patient understands diagnosis:  Yes Discussing patient identified problems/goals with staff:  Yes Medical problems stabilized or resolved:  Yes Denies suicidal/homicidal ideation: Yes Issues/concerns per patient self-inventory:  None noted  Requests d/c Other:  New problem(s) identified: N/A  Reason for Continuation of Hospitalization: Other; describe None D/C today  Interventions implemented related to continuation of hospitalization:   Additional comments:  Estimated length of stay: D/C today  Discharge Plan: Return home  Follow up outpt.  New goal(s): N/A  Review of initial/current patient goals per problem list:   1.  Goal(s):safely detox from benzos  Met:  Yes  Target date:  As evidenced by:  2.  Goal (s):Decrease anxiety  Met:  Yes  Target date:  As evidenced by:Increasing coping skills thru group, decrease in score on self inventory to less 5 or less  3.  Goal(s):  Met:  Yes  Target date:  As evidenced by:  4.  Goal(s):  Met:No    As evidenced by:  Attendees: Patient: Diana Goodman  10/22/2011 8:07 AM  Family:     Physician:  Lupe Carney 10/22/2011 8:07 AM   Nursing: Carolynn Comment   10/22/2011 8:07 AM   Case Manager:  Richelle Ito, LCSW 10/22/2011 8:07 AM   Counselor:  Ronda Fairly, LCSWA 10/22/2011 8:07 AM   Other: Shelda Jakes PA  10/22/2011 8:07 AM  Other:     Other:     Other:      Scribe for Treatment Team:   Daryel Gerald B, 10/22/2011 8:07 AM

## 2011-10-22 NOTE — Discharge Summary (Signed)
Diana Goodman 1976-09-16 36 y.o.  [id number]     Date of Admission:  10/18/2011 Date of Discharge:  10/23/2011 Diagnosis: AXIS I Anxiety Disorder NOS; Hx reported OCD and MDD; r/o BPD; Benzo W/D resolved  AXIS II Deferred  AXIS III Past Medical History  Diagnosis Date  .       AXIS IV problems with access to health care services  AXIS V 51-60 moderate symptoms    HPI: Diana Goodman was admitted to [facility] from ED where she presented with Klonopin OD and reported symptoms of suicidal ideation, depression and increased anxiety.  Hospital Course:      The duration of Diana Goodman's stay at [facility] was unremarkable.      The patient was seen and evaluated by the Treatment team consisting of Psychiatrist, PAC, RN, Case Manager, and Therapist for evaluation and treatment plan with goal of stabilization upon discharge. The patient's physical and mental health problems were identified and treated appropriately.      Multiple modalities of treatment were used including medication, individual and group therapies, unit programming, AA/NA, improved nutrition, physical activity, and family sessions as needed.  Diana Goodman's home medications were discontinued with the exception of the prozac which was continued at 40mg  a day.     The symptoms of alcohol/substance abuse withdrawal were monitored daily by serial clinical withdrawal scores. Improvement was demonstrated by declining CIWA/COWS numbers, improving vital signs, increased cognition, and improvement in mood, sleep, appetite as well as a reduction in psychosocial symptoms.       The patient was evaluated and found to be stable enough for discharge and was released to home per the initial plan of treatment.   Mental Status Exam:  For mental status exam please see mental status exam and  suicide risk assessment completed by attending physician prior to discharge. BP 119/81  Pulse 54  Temp(Src) 97.2 F (36.2 C) (Oral)  Resp 16 Labs: None pending Level of Care:  OP  Meds  on Discharge: Medication List  As of 10/23/2011  8:13 AM   STOP taking these medications         buPROPion 300 MG 24 hr tablet      clonazePAM 2 MG tablet      methylphenidate 10 MG tablet         TAKE these medications         FLUoxetine 40 MG capsule   Commonly known as: PROZAC   Take 40 mg by mouth daily.                  Vitamin D (Ergocalciferol) 50000 UNITS Caps   Commonly known as: DRISDOL   Take 50,000 Units by mouth every 7 (seven) days.           Is patient on multiple antipsychotic therapies at discharge:  No   Has Patient had three or more failed trials of antipsychotic monotherapy by history:  No  Follow-up recommendations:  Other:  Pt. is advised to check her health insurance benefits to see what local providers are in her network. She will need to see Dr. Evelene Croon until she can be established with another provider. Discharge destination:  Home Comments: Please keep all follow up appointments as scheduled. Diana Goodman. Diana Goodman PAC for DR.Alyson Kuroski-MazzieMD 10/23/2011

## 2011-10-22 NOTE — [facility] Suicide Risk Assessment (Signed)
Suicide Risk Assessment  Discharge Assessment     Demographic factors:  See chart.    Current Mental Status: Patient seen and evaluated in treatment team. Chart reviewed. Patient stated that her mood was "good".  She reported sleeping better and reported less anxiety.  Her affect was mood congruent and euthymic. She denied any current thoughts of self injurious behavior, suicidal ideation or homicidal ideation. She denied any significant depressive signs or symptoms at this time. There were no auditory or visual hallucinations, paranoia, delusional thought processes, or mania noted.  Thought process was linear and goal directed.  No psychomotor agitation or retardation was noted. Speech was normal rate, tone and volume. Eye contact was good. Judgment and insight are improved.  Patient has been up and engaged on the unit.  No acute safety concerns reported from team.    Loss Factors: son with autism   Historical Factors:  Historical Factors: Family history of suicide;Family history of mental illness or substance abuse; s/p OD; Hx sig OCD  Risk Reduction Factors:  Risk Reduction Factors: Sense of responsibility to family;Living with another person, especially a relative;Positive social support;Responsible for children under 72 years of age; Inisght into need for Tx, exercise and proper diet  CLINICAL FACTORS: Anxiety Disorder NOS; Hx reported OCD and MDD; r/o BPD; Benzo W/D resolved  COGNITIVE FEATURES THAT CONTRIBUTE TO RISK: limited coping skills   SUICIDE RISK: Pt viewed as a chronic increased risk of harm to self in light of her past hx and risk factors.  No acute safety concerns on the unit.  Pt contracting for safety and stable for discharge.  PLAN OF CARE: Pt seen and evaluated in team. Chart reviewed.  Pt stable for and requesting discharge. Pt contracting for safety and does not currently meet [facility] involuntary commitment criteria for continued hospitalization.  Mental health treatment,  medication management and continued sobriety will mitigate against the increased risk of harm to self and/or others.  Discussed the importance of recovery further with pt, as well as, tools to move forward in a healthy & safe manner.  Pt agreeable with the plan.  Discussed with the team.  Please see orders, follow up plans per team and full discharge summary completed by physician extender.  Discussed med management with pt at great detail for future stability.   Diana Goodman 10/22/2011, 11:52 AM

## 2011-10-23 NOTE — Progress Notes (Signed)
Patient Discharge Instructions:  Admission Note Faxed,  10/23/2011 After Visit Summary Faxed,  10/23/2011 Faxed to the Next [facility] Care provider:  10/23/2011 D/C Summary faxed 10/23/2011 Facesheet faxed 10/23/2011  Faxed to Dr. Evelene Croon @ [phone]  Wandra Scot, 10/23/2011, 5:52 PM

## 2014-07-03 ENCOUNTER — Encounter (HOSPITAL_COMMUNITY): Payer: Self-pay | Admitting: Emergency Medicine

## 2014-07-03 ENCOUNTER — Emergency Department (HOSPITAL_COMMUNITY)
Admission: EM | Admit: 2014-07-03 | Discharge: 2014-07-03 | Disposition: A | Discharge status: Home / Self Care | Payer: 59 | Attending: Emergency Medicine | Admitting: Emergency Medicine

## 2014-07-03 DIAGNOSIS — Z79899 Other long term (current) drug therapy: Secondary | ICD-10-CM | POA: Insufficient documentation

## 2014-07-03 DIAGNOSIS — F419 Anxiety disorder, unspecified: Secondary | ICD-10-CM | POA: Diagnosis present

## 2014-07-03 DIAGNOSIS — Z880 Allergy status to penicillin: Secondary | ICD-10-CM | POA: Insufficient documentation

## 2014-07-03 DIAGNOSIS — F319 Bipolar disorder, unspecified: Secondary | ICD-10-CM | POA: Diagnosis not present

## 2014-07-03 DIAGNOSIS — G479 Sleep disorder, unspecified: Secondary | ICD-10-CM | POA: Diagnosis not present

## 2014-07-03 DIAGNOSIS — F42 Obsessive-compulsive disorder: Secondary | ICD-10-CM | POA: Insufficient documentation

## 2014-07-03 HISTORY — DX: Obsessive-compulsive disorder, unspecified: F42.9

## 2014-07-03 MED ORDER — ALPRAZOLAM 0.25 MG PO TABS: 0.2500 mg | ORAL_TABLET | Freq: Every evening | ORAL | Status: DC | PRN

## 2014-07-03 MED ORDER — LORAZEPAM 0.5 MG PO TABS
0.5000 mg | ORAL_TABLET | Freq: Once | ORAL | Status: AC
  Administered 2014-07-03: 0.5 mg via ORAL
  Filled 2014-07-03: qty 1

## 2014-07-03 NOTE — ED Notes (Signed)
Bed: WA06 Expected date: 07/03/14 Expected time: 12:26 AM Means of arrival: Ambulance Comments: 38 yo F  Anxiety, adverse reaction to Jordanlatuda

## 2014-07-03 NOTE — Discharge Instructions (Signed)

## 2014-07-03 NOTE — ED Provider Notes (Signed)
CSN: [id number]     Arrival date & time 07/03/14  0038 History   First MD Initiated Contact with Patient 07/03/14 0045     Chief Complaint  Patient presents with  . Anxiety    called 911 because she was really nervous.     (Consider location/radiation/quality/duration/timing/severity/associated sxs/prior Treatment) HPI Patient presents with history of depression, anxiety, bipolar, OCD. She states she began feeling anxious this evening. She states she does not know it was due to the inclement weather. She is feeling tremulous and "fidgety". Denies any pain. Denies any depression. She denies any homicidal or suicidal ideation. Denies any drug use. She states she's been taking increased dose of Latuda which she thinks may make her more anxious.  Past Medical History  Diagnosis Date  . Depression   . Anxiety   . Bipolar 1 disorder, depressed   . OCD (obsessive compulsive disorder)    History reviewed. No pertinent past surgical history. History reviewed. No pertinent family history. History  Substance Use Topics  . Smoking status: Never Smoker   . Smokeless tobacco: Not on file  . Alcohol Use: Yes     Comment: once per month   OB History   Grav Para Term Preterm Abortions TAB SAB Ect Mult Living                 Review of Systems  Constitutional: Negative for fever and chills.  Respiratory: Negative for shortness of breath.   Cardiovascular: Negative for chest pain.  Gastrointestinal: Negative for nausea, vomiting and abdominal pain.  Musculoskeletal: Negative for back pain, neck pain and neck stiffness.  Neurological: Negative for dizziness, weakness, light-headedness, numbness and headaches.  Psychiatric/Behavioral: Positive for sleep disturbance and agitation. Negative for suicidal ideas and hallucinations. The patient is nervous/anxious and is hyperactive.   All other systems reviewed and are negative.     Allergies  Penicillins  Home Medications   Prior to  Admission medications   Medication Sig Start Date End Date Taking? Authorizing Provider  FLUoxetine (PROZAC) 40 MG capsule Take 40 mg by mouth daily.   Yes Historical Provider, MD  levonorgestrel (MIRENA) 20 MCG/24HR IUD 1 each by Intrauterine route continuous.   Yes Historical Provider, MD  lurasidone (LATUDA) 40 MG TABS tablet Take 40 mg by mouth daily with breakfast.   Yes Historical Provider, MD   BP 126/79  Pulse 108  Temp(Src) 97.9 F (36.6 C) (Oral)  Resp 20  SpO2 100% Physical Exam  Nursing note and vitals reviewed. Constitutional: She is oriented to person, place, and time. She appears well-developed and well-nourished. No distress.  Anxious appearing but calms with conversation  HENT:  Head: Normocephalic and atraumatic.  Mouth/Throat: Oropharynx is clear and moist.  Eyes: EOM are normal. Pupils are equal, round, and reactive to light.  Neck: Normal range of motion. Neck supple. No thyromegaly present.  No meningismus  Cardiovascular: Regular rhythm.   Tachycardia  Pulmonary/Chest: Effort normal and breath sounds normal. No respiratory distress. She has no wheezes. She has no rales.  Abdominal: Soft. Bowel sounds are normal. She exhibits no distension and no mass. There is no tenderness. There is no rebound and no guarding.  Musculoskeletal: Normal range of motion. She exhibits no edema and no tenderness.  Neurological: She is alert and oriented to person, place, and time.  Patient is alert and oriented x3 with clear, goal oriented speech. Patient has 5/5 motor in all extremities. Sensation is intact to light touch. Bilateral finger-to-nose is normal  with no signs of dysmetria. Patient has a normal gait and walks without assistance. No tremor  Skin: Skin is warm and dry. No rash noted. No erythema.  Psychiatric:  Anxious. No SI or HI    ED Course  Procedures (including critical care time) Labs Review Labs Reviewed - No data to display  Imaging Review No results  found.   EKG Interpretation None      MDM   Final diagnoses:  None    We'll treat with benzodiazepine. Patient is advised to followup with her outpatient psychiatrist. Return precautions given   Loren Racer, MD 07/03/14 [phone]

## 2014-07-03 NOTE — ED Notes (Signed)
Patient felt anxious and called EMS. Patient says she is feeling jittery.

## 2017-12-04 ENCOUNTER — Encounter: Payer: Self-pay | Admitting: Neurology

## 2017-12-04 ENCOUNTER — Ambulatory Visit: Payer: PRIVATE HEALTH INSURANCE | Admitting: Neurology

## 2017-12-04 VITALS — BP 98/63 | HR 82 | Ht 64.0 in | Wt 137.0 lb

## 2017-12-04 DIAGNOSIS — G44011 Episodic cluster headache, intractable: Secondary | ICD-10-CM

## 2017-12-04 DIAGNOSIS — Z91199 Patient's noncompliance with other medical treatment and regimen due to unspecified reason: Secondary | ICD-10-CM | POA: Insufficient documentation

## 2017-12-04 DIAGNOSIS — G43719 Chronic migraine without aura, intractable, without status migrainosus: Secondary | ICD-10-CM | POA: Diagnosis not present

## 2017-12-04 DIAGNOSIS — F332 Major depressive disorder, recurrent severe without psychotic features: Secondary | ICD-10-CM | POA: Diagnosis not present

## 2017-12-04 DIAGNOSIS — Z9119 Patient's noncompliance with other medical treatment and regimen: Secondary | ICD-10-CM | POA: Insufficient documentation

## 2017-12-04 MED ORDER — PROMETHAZINE HCL 50 MG PO TABS: 50.0000 mg | ORAL_TABLET | Freq: Four times a day (QID) | ORAL | 1 refills | Status: DC | PRN

## 2017-12-04 MED ORDER — SUMATRIPTAN SUCCINATE 100 MG PO TABS: ORAL_TABLET | ORAL | 1 refills | Status: DC

## 2017-12-04 NOTE — Patient Instructions (Signed)
Promethazine tablets What is this medicine? PROMETHAZINE (proe METH a zeen) is an antihistamine. It is used to treat allergic reactions and to treat or prevent nausea and vomiting from illness or motion sickness. It is also used to make you sleep before surgery, and to help treat pain or nausea after surgery. This medicine may be used for other purposes; ask your health care provider or pharmacist if you have questions. COMMON BRAND NAME(S): Phenergan What should I tell my health care provider before I take this medicine? They need to know if you have any of these conditions: -glaucoma -high blood pressure or heart disease -kidney disease -liver disease -lung or breathing disease, like asthma -prostate trouble -pain or difficulty passing urine -seizures -an unusual or allergic reaction to promethazine or phenothiazines, other medicines, foods, dyes, or preservatives -pregnant or trying to get pregnant -breast-feeding How should I use this medicine? Take this medicine by mouth with a glass of water. Follow the directions on the prescription label. Take your doses at regular intervals. Do not take your medicine more often than directed. Talk to your pediatrician regarding the use of this medicine in children. Special care may be needed. This medicine should not be given to infants and children younger than 2 years old. Overdosage: If you think you have taken too much of this medicine contact a poison control center or emergency room at once. NOTE: This medicine is only for you. Do not share this medicine with others. What if I miss a dose? If you miss a dose, take it as soon as you can. If it is almost time for your next dose, take only that dose. Do not take double or extra doses. What may interact with this medicine? Do not take this medicine with any of the following medications: -cisapride -dofetilide -dronedarone -MAOIs like Carbex, Eldepryl, Marplan, Nardil,  Parnate -pimozide -quinidine, including dextromethorphan; quinidine -thioridazine -ziprasidone This medicine may also interact with the following medications: -certain medicines for depression, anxiety, or psychotic disturbances -certain medicines for anxiety or sleep -certain medicines for seizures like carbamazepine, phenobarbital, phenytoin -certain medicines for movement abnormalities as in Parkinson's disease, or for gastrointestinal problems -epinephrine -medicines for allergies or colds -muscle relaxants -narcotic medicines for pain -other medicines that prolong the QT interval (cause an abnormal heart rhythm) -tramadol -trimethobenzamide This list may not describe all possible interactions. Give your health care provider a list of all the medicines, herbs, non-prescription drugs, or dietary supplements you use. Also tell them if you smoke, drink alcohol, or use illegal drugs. Some items may interact with your medicine. What should I watch for while using this medicine? Tell your doctor or health care professional if your symptoms do not start to get better in 1 to 2 days. You may get drowsy or dizzy. Do not drive, use machinery, or do anything that needs mental alertness until you know how this medicine affects you. To reduce the risk of dizzy or fainting spells, do not stand or sit up quickly, especially if you are an older patient. Alcohol may increase dizziness and drowsiness. Avoid alcoholic drinks. Your mouth may get dry. Chewing sugarless gum or sucking hard candy, and drinking plenty of water may help. Contact your doctor if the problem does not go away or is severe. This medicine may cause dry eyes and blurred vision. If you wear contact lenses you may feel some discomfort. Lubricating drops may help. See your eye doctor if the problem does not go away or is severe. This   medicine can make you more sensitive to the sun. Keep out of the sun. If you cannot avoid being in the sun,  wear protective clothing and use sunscreen. Do not use sun lamps or tanning beds/booths. If you are diabetic, check your blood-sugar levels regularly. What side effects may I notice from receiving this medicine? Side effects that you should report to your doctor or health care professional as soon as possible: -blurred vision -irregular heartbeat, palpitations or chest pain -muscle or facial twitches -pain or difficulty passing urine -seizures -skin rash -slowed or shallow breathing -unusual bleeding or bruising -yellowing of the eyes or skin Side effects that usually do not require medical attention (report to your doctor or health care professional if they continue or are bothersome): -headache -nightmares, agitation, nervousness, excitability, not able to sleep (these are more likely in children) -stuffy nose This list may not describe all possible side effects. Call your doctor for medical advice about side effects. You may report side effects to FDA at 1-800-FDA-1088. Where should I keep my medicine? Keep out of the reach of children. Store at room temperature, between 20 and 25 degrees C (68 and 77 degrees F). Protect from light. Throw away any unused medicine after the expiration date. NOTE: This sheet is a summary. It may not cover all possible information. If you have questions about this medicine, talk to your doctor, pharmacist, or health care provider.  2018 Elsevier/Gold Standard (2013-05-19 15:04:46) Sumatriptan tablets What is this medicine? SUMATRIPTAN (soo ma TRIP tan) is used to treat migraines with or without aura. An aura is a strange feeling or visual disturbance that warns you of an attack. It is not used to prevent migraines. This medicine may be used for other purposes; ask your health care provider or pharmacist if you have questions. COMMON BRAND NAME(S): Imitrex, Migraine Pack What should I tell my health care provider before I take this medicine? They need to  know if you have any of these conditions: -circulation problems in fingers and toes -diabetes -heart disease -high blood pressure -high cholesterol -history of irregular heartbeat -history of stroke -kidney disease -liver disease -postmenopausal or surgical removal of uterus and ovaries -seizures -smoke tobacco -stomach or intestine problems -an unusual or allergic reaction to sumatriptan, other medicines, foods, dyes, or preservatives -pregnant or trying to get pregnant -breast-feeding How should I use this medicine? Take this medicine by mouth with a glass of water. Follow the directions on the prescription label. This medicine is taken at the first symptoms of a migraine. It is not for everyday use. If your migraine headache returns after one dose, you can take another dose as directed. You must leave at least 2 hours between doses, and do not take more than 100 mg as a single dose. Do not take more than 200 mg total in any 24 hour period. If there is no improvement at all after the first dose, do not take a second dose without talking to your doctor or health care professional. Do not take your medicine more often than directed. Talk to your pediatrician regarding the use of this medicine in children. Special care may be needed. Overdosage: If you think you have taken too much of this medicine contact a poison control center or emergency room at once. NOTE: This medicine is only for you. Do not share this medicine with others. What if I miss a dose? This does not apply; this medicine is not for regular use. What may interact with this  medicine? Do not take this medicine with any of the following medicines: -cocaine -ergot alkaloids like dihydroergotamine, ergonovine, ergotamine, methylergonovine -feverfew -MAOIs like Carbex, Eldepryl, Marplan, Nardil, and Parnate -other medicines for migraine headache like almotriptan, eletriptan, frovatriptan, naratriptan, rizatriptan,  zolmitriptan -tryptophan This medicine may also interact with the following medications: -certain medicines for depression, anxiety, or psychotic disturbances This list may not describe all possible interactions. Give your health care provider a list of all the medicines, herbs, non-prescription drugs, or dietary supplements you use. Also tell them if you smoke, drink alcohol, or use illegal drugs. Some items may interact with your medicine. What should I watch for while using this medicine? Only take this medicine for a migraine headache. Take it if you get warning symptoms or at the start of a migraine attack. It is not for regular use to prevent migraine attacks. You may get drowsy or dizzy. Do not drive, use machinery, or do anything that needs mental alertness until you know how this medicine affects you. To reduce dizzy or fainting spells, do not sit or stand up quickly, especially if you are an older patient. Alcohol can increase drowsiness, dizziness and flushing. Avoid alcoholic drinks. Smoking cigarettes may increase the risk of heart-related side effects from using this medicine. If you take migraine medicines for 10 or more days a month, your migraines may get worse. Keep a diary of headache days and medicine use. Contact your healthcare professional if your migraine attacks occur more frequently. What side effects may I notice from receiving this medicine? Side effects that you should report to your doctor or health care professional as soon as possible: -allergic reactions like skin rash, itching or hives, swelling of the face, lips, or tongue -bloody or watery diarrhea -hallucination, loss of contact with reality -pain, tingling, numbness in the face, hands, or feet -seizures -signs and symptoms of a blood clot such as breathing problems; changes in vision; chest pain; severe, sudden headache; pain, swelling, warmth in the leg; trouble speaking; sudden numbness or weakness of the  face, arm, or leg -signs and symptoms of a dangerous change in heartbeat or heart rhythm like chest pain; dizziness; fast or irregular heartbeat; palpitations, feeling faint or lightheaded; falls; breathing problems -signs and symptoms of a stroke like changes in vision; confusion; trouble speaking or understanding; severe headaches; sudden numbness or weakness of the face, arm, or leg; trouble walking; dizziness; loss of balance or coordination -stomach pain Side effects that usually do not require medical attention (report to your doctor or health care professional if they continue or are bothersome): -changes in taste -facial flushing -headache -muscle cramps -muscle pain -nausea, vomiting -weak or tired This list may not describe all possible side effects. Call your doctor for medical advice about side effects. You may report side effects to FDA at 1-800-FDA-1088. Where should I keep my medicine? Keep out of the reach of children. Store at room temperature between 2 and 30 degrees C (36 and 86 degrees F). Throw away any unused medicine after the expiration date. NOTE: This sheet is a summary. It may not cover all possible information. If you have questions about this medicine, talk to your doctor, pharmacist, or health care provider.  2018 Elsevier/Gold Standard (2015-10-20 12:38:23)

## 2017-12-04 NOTE — Progress Notes (Signed)
Provider:  Melvyn Novas, M D  Primary Care Physician:  Salli Real, MD   Referring Provider: Salli Real, MD  @ [facility] on [address].    Chief Complaint  Patient presents with  . New Patient (Initial Visit)    pt room 10, pt alone, everyday headaches for the past 3 months. pt said that PCP.  pt was given a sample emgality. It has not helped as well as was given topiramate 25mg   but has not had relief. she has been taken 10 tylenol and excedrin migraine over the counter with no relief. states that she has no energy which has been worse since dealing with the headaches    HPI:  Diana Goodman is a 42 y.o. female , seen here as a referral  from Dr. Wynelle Link for evaluation of headaches -  Diana Goodman was last seen at [facility] on 29 November 2017 where she received a prefilled pen syringe for an gallop T 120 mg/mL.  Last Dr. soon had determined that she has chronic migraines more than 15 migraine days a month almost every day.  He also started her on topiramate 25 mg twice a day as a preventive in the same visit.  He ordered a CT of the head which has not been done yet, and referred to [place] at [facility].  The patient's current medications include vitamin D 50,000 units, Lexapro 10 mg just started on 19 November 2017, she takes a multi vitamin and she has a Mirena IUD in place.  And clonazepam 1 mg up to 3 times a day- apparently this has been provided by Dr. Wynelle Link.  Diana Goodman reports she developed Headches about 3 month ago, slowly increasing in intensity to a constant 8 out of 10. she states that she has been very nauseated with her headaches, she feels unproductive, she is tearful, and appears depressed.  She has been widowed at 42 years of age, and she has been followed at [facility] medical apparently for depression.  There were a lot of stressors that would explain her depressed mood she also has a sense of fatigue daytime sleepiness, easily  distractible, decreased attention to detail, financial stressors family stressors.  Her headaches also can be exacerbated by emotional stress and financial worries.  She had suicidal ideation in the past, and a suicide attempt.  She immediately asked for PAIN MEDICATION when I went through her current medication list, and explained that the medication she takes is preventive . She cannot take NSAIDS because of an ulcer.   Chief complaint according to patient :  "Suicide attempt a few weeks ago "" my head hurst so bad I need it fixed- I don't want to be here with this headache ( suicidal )   headaches are constant, the same all day long , every day. Located in both temples, and forehead. Around her eyes and sometimes localized at the nape of the neck, She has sometimes blurred vision, when reading. She has an appointment with an eye doctor. Bright light bothers, the TV hurts her eyes, sounds are bothersome to a lesser extend. She has been nauseated and has vomited.  No hemiparesis.     Medical history and family history: mother has migraines, maternal cousin. Depression is present in her mother, father, brothers.  2 maternal relatives have committed suicide.   Social history: widowed, depressed and not in psychiatric care- Dr Wynelle Link referred but she has not been given an  appointment. No tobacco use, no ETOH use, caffeine _ some Soda- not daily , not coffee, not tea . NO Shift work history. 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   Review of Systems: Out of a complete 14 system review, the patient complains of only the following symptoms, and all other reviewed systems are negative. PHQ 9 - No flowsheet data found.  Social History   Socioeconomic History  . Marital status: Widowed    Spouse name: Not on file  . Number of children: Not on file  . Years of education: Not on file  . Highest education level: Not on file  Social  Needs  . Financial resource strain: Not on file  . Food insecurity - worry: Not on file  . Food insecurity - inability: Not on file  . Transportation needs - medical: Not on file  . Transportation needs - non-medical: Not on file  Occupational History  . Not on file  Tobacco Use  . Smoking status: Never Smoker  . Smokeless tobacco: Never Used  Substance and Sexual Activity  . Alcohol use: No    Frequency: Never  . Drug use: No  . Sexual activity: Not on file  Other Topics Concern  . Not on file  Social History Narrative  . Not on file    Family History  Problem Relation Age of Onset  . Depression Mother   . Anxiety disorder Mother   . Migraines Mother   . Depression Father   . Anxiety disorder Father   . Depression Brother   . Anxiety disorder Brother     Past Medical History:  Diagnosis Date  . ADHD   . Anxiety   . Depression   . Headache   . OCD (obsessive compulsive disorder)   . Vitamin D deficiency     No past surgical history on file.  Current Outpatient Medications  Medication Sig Dispense Refill  . clonazePAM (KLONOPIN) 1 MG tablet Take 1 mg by mouth 3 (three) times daily as needed for anxiety.    . DEXILANT 60 MG capsule 1 capsule daily.  0  . doxycycline (VIBRAMYCIN) 100 MG capsule 1 capsule daily.    . ergocalciferol (VITAMIN D2) 50000 units capsule Take 50,000 Units by mouth once a week.    . escitalopram (LEXAPRO) 10 MG tablet Take 10 mg by mouth daily.    Marland Kitchen levonorgestrel (MIRENA) 20 MCG/24HR IUD 1 each by Intrauterine route continuous.    . topiramate (TOPAMAX) 25 MG tablet Take 25 mg by mouth 2 (two) times daily.     No current facility-administered medications for this visit.     Allergies as of 12/04/2017 - Review Complete 12/04/2017  Allergen Reaction Noted  . Penicillins Nausea And Vomiting 10/18/2011    Vitals: BP 98/63   Pulse 82   Ht 5\' 4"  (1.626 m)   Wt 137 lb (62.1 kg)   BMI 23.52 kg/m  Last Weight:  Wt Readings from Last  1 Encounters:  12/04/17 137 lb (62.1 kg)   ZOX:WRUE mass index is 23.52 kg/m.     Last Height:   Ht Readings from Last 1 Encounters:  12/04/17 5\' 4"  (1.626 m)    Physical exam:  General: The patient is awake, alert and appears not in acute distress. The patient is well groomed. Head: Normocephalic, atraumatic. Neck is supple. Mallampati 2  neck circumference:13.5 ". Nasal airflow patent ,  Cardiovascular:  Regular rate and rhythm , without  murmurs or carotid bruit, and without distended neck veins. Respiratory: Lungs are clear to auscultation. Skin:  Without evidence of edema, or rash Trunk: BMI is low. The patient's body language is demure, shrinking, pleading. Regressive.   Neurologic exam : The patient is awake and alert, oriented to place and time.   Attention span & concentration ability appears normal.  Speech is fluent,  without dysarthria, dysphonia or aphasia.  Mood and affect are appropriate.  Cranial nerves: Pupils are equal and briskly reactive to light. Funduscopic exam without evidence of pallor or edema, pink retina, . Extraocular movements  in vertical and horizontal planes intact and without nystagmus. Visual fields by finger perimetry are intact. Hearing to finger rub intact.   Facial sensation intact to fine touch.  Facial motor strength is symmetric and tongue and uvula move midline. Shoulder shrug was symmetrical.   Motor exam: Normal tone, muscle bulk and symmetric strength in all extremities.  Sensory:  Fine touch, pinprick and vibration were tested in all extremities. Proprioception tested in the upper extremities was normal.  Coordination: Rapid alternating movements in the fingers/hands was normal. Finger-to-nose maneuver normal without evidence of ataxia, dysmetria or tremor.  Gait and station: Patient walks without assistive device .Tandem gait is unfragmented. Turns with 3 Steps.   Deep tendon reflexes: in the  upper and lower extremities are  symmetric and intact.    Diana Goodman has been scheduled for a CT of the brain she [place] at [place] location of [facility]. She is not sure if the CT is ordered with or without contrast, but reports that she is worried that that could be an abnormality such as a tumor.  Her headaches are relatively new in onset about 3 months total, she does not have focal neurological deficits, she describes migrainous headaches.  There is no evidence of papilledema.  Her physician has referred her for transcranial magnetic stimulation but she mistook this for an electroshock therapy, and I explained to her that she should go ahead this is a very low side effect treatment, not painful, and can help people with chronic depression and headaches very well.  I also encouraged her to continue centimeters reality Q 30 days, and since she just started on topiramate she may need not just to continue the current regimen but to increase the dose of Topamax to 50 mg twice a day.  I had to advise that topiramate taken at about 100 mg daily total dose can interfere with hormonal birth control. She does not understand the concept of prevention and to maintain a regimen on both medications. She has taken 11 Topiramate in one day !!!. I asked her to never do this again, and I am worried about her suicidal tendency.   I will give this patient Imitrex 100 mg tab. And refer her back to [facility] medical center which has neurological care.  Her PCP is obliged to arrange for appropriate psychiatric care. I will suggest an MRI brain in a patient with recent onset headaches.    Assessment:  After physical and neurologic examination, review of laboratory studies,  Personal review of imaging studies, reports of other /same  Imaging studies, results of polysomnography and / or neurophysiology testing and pre-existing records as far as provided in visit., my assessment is   1) Chronic migraine in a severely depressed patient.  Onset only 3 month.   2) continue Emgality and Topiramate -  I suggest 50 mg bid.   3) Imitrex 100 mg not exceeding 2 doses in 24 hours.    The patient was advised of the nature of the diagnosed disorder , the treatment options and the  risks for general health and wellness arising from not treating the condition.   I spent more than 50 minutes of face to face time with the patient.  Greater than 50% of time was spent in counseling and coordination of care. We have discussed the diagnosis and differential and I answered the patient's questions.    Plan:  Treatment plan and additional workup :  Dear Dr. Wynelle LinkSun- please see this patiet on March 15 th in follow up and make sure when she sees psychiatry.  I have added imitrex for immediate migraine relief to this patients medication. She should not use lexapro or other SSRIs with this medication, may hold off on the day of imitrex use.  She will likely respond to emgality by week 6-9 and not immediately .    Melvyn NovasARMEN Christin Moline, MD 12/04/2017, 11:00 AM  Certified in Neurology by ABPN Certified in Sleep Medicine by [facility]  [facility] [address], [address] [place], [place] [id number]

## 2017-12-10 ENCOUNTER — Telehealth: Payer: Self-pay | Admitting: Neurology

## 2017-12-10 NOTE — Telephone Encounter (Signed)
Called pt LVM, for pt to call the office.

## 2017-12-10 NOTE — Telephone Encounter (Addendum)
Pt called she [facility]. She is wanting to know if the CD has been rec'd. Please call to advise

## 2017-12-10 NOTE — Telephone Encounter (Signed)
Called the patient to make her aware that you are only allowed 10 tab per month per insurance because this medication is a emergency use migraine relief. It should not be taken more then 2 times a wk/ or twice in a 24 hour period. The patient states that she has had a migraine daily so she just wants relief. I informed her that taking it daily is not how it was ordered though.   Upon her office visit last time she admitted to taking 3-6 Topamax pills in one day despite what was on the bottle and including other meds such as tylenol over the counter. The patient.  When I told her that we can't write another script for the imitrex because A) insurance won't pay for it and B it is not recommended based off how it should be taken. We are still waiting on results from the CT of her head which we haven't gotten yet. The patient asked" well what do I do about these headaches I am having daily, I just want relief" I have informed her per Dr Dohmeier's recommendation she recommends the patient follow up with [facility] her and suggest they attempt to increase the dose of the topamax to 50 mg BID. The patient then asked " so can I just take like  Tylenol a day" I informed her no and did strong education with her in regards to her medication and how she is taken her meds incorrect and not how they are prescribed. When I replied and told her about how she should not exceed more the 4 G of tylenol a day she said " they just put that on the bottle it doesn't really matter" I replied it does when you have taken so much that you end up In the emergency room with liver failure. I attempted several times to educate her on making sure she is reading the directions of the medication bottles and following the directions that are written and laid out to her for her health and her benefit.   Once more I encouraged the patient to call her PCP and discuss titrating up on the Topamax as her  preventative medication. Also the patient already has a dose on the emgality on board and should allow for the change for that medication to start working. Pt verbalized understanding but was not happy with the fact that we had any other suggestion for her.   I transferred the call to debra settle in medical records so they could discuss getting her Ct results so that Dr Vickey Hugerohmeier can review.

## 2017-12-10 NOTE — Telephone Encounter (Signed)
Patient has had a headache every day for 3 months. She started taking SUMAtriptan (IMITREX) 100 MG tablet and it has helped. She only received 10 pills and called for a refill and the pharmacy told her it could not be filled.  Why was the Rx for just 10 pills?  Please call and discuss.

## 2017-12-11 NOTE — Telephone Encounter (Signed)
This patient has repeatedly not followed medical advice as self reported during her initial visit ;" I took 11 topiramates one day and still had a headache". She reluctantly reported her suicide attempt earlier , just before she was referred to me, by overdosing on medication. Her PCP's referral note omitted this. The patient now took her 30 day supply for  Imitrex in 6 days, in spite of information being given that this is dangerous.  She was told which of her medications as provided by PCP were preventive and what to use (sparingly) for acute migraine.    The patient was sent to us the day before her PCP had arranged for head CT - and I still haven't seen results. Her PCPs office works with an in house neurologist .   I cannot follow Diana Goodman and I am very concerned that she will self harm with any medication I provide as she is not following Instructions.   I will ask Diana Goodman to file a letter of dismissal.   Melvyn Novasarmen Flannery Cavallero, MD

## 2017-12-12 ENCOUNTER — Encounter: Payer: Self-pay | Admitting: Neurology

## 2018-02-14 ENCOUNTER — Encounter: Payer: Self-pay | Admitting: Neurology

## 2018-02-17 ENCOUNTER — Ambulatory Visit: Payer: Self-pay | Admitting: Neurology

## 2018-02-21 ENCOUNTER — Encounter: Payer: Self-pay | Admitting: Neurology

## 2018-02-21 ENCOUNTER — Ambulatory Visit (INDEPENDENT_AMBULATORY_CARE_PROVIDER_SITE_OTHER): Payer: Medicaid Other | Admitting: Neurology

## 2018-02-21 VITALS — BP 100/72 | HR 117 | Ht 64.0 in | Wt 135.0 lb

## 2018-02-21 DIAGNOSIS — R51 Headache: Secondary | ICD-10-CM

## 2018-02-21 DIAGNOSIS — G43719 Chronic migraine without aura, intractable, without status migrainosus: Secondary | ICD-10-CM | POA: Diagnosis not present

## 2018-02-21 DIAGNOSIS — R519 Headache, unspecified: Secondary | ICD-10-CM

## 2018-02-21 DIAGNOSIS — G44211 Episodic tension-type headache, intractable: Secondary | ICD-10-CM

## 2018-02-21 NOTE — Progress Notes (Signed)
NEUROLOGY CONSULTATION NOTE  Diana Goodman MRN: [id number] DOB: [birth year]  Referring provider: Dr. Wynelle Link Primary care provider: Dr. Wynelle Link  Reason for consult:  migraines  HISTORY OF PRESENT ILLNESS: Diana Goodman is a 42 year old right-handed female with depression/anxiety and elevated liver enzymes who presents for migraines.  History supplemented by prior neurologist's note.  Onset:  About 5 months ago Location:  Bi-frontal/temporal, sometimes occipital Quality:  Initially pounding, now non-throbbing pressure Intensity:  Moderate.  She denies new headache, thunderclap headache or severe headache that wakes her from sleep. Aura:  no Prodrome:  no Postdrome:  no Associated symptoms:  Initially nausea, blurred vision, photophobia, phonophobia.  Now, she denies any associated symptoms.  She denies associated unilateral numbness or weakness. Duration:  They were initially constant, but then became episodic, lasting up to 10 days.   Frequency:  Unclear.   Frequency of abortive medication:  No medication Triggers/exacerbating factors:  stress Relieving factors:  Applying pressure on her temples Activity:  aggravates  Current NSAIDS:  She was told it was contraindicated due to history of gastric ulcer Current analgesics:  no Current triptans:  sumatriptan  Current anti-emetic:  Promethazine  Current muscle relaxants:  no Current anti-anxiolytic:  clonazepam Current sleep aide:  clonazepam Current Antihypertensive medications:  no Current Antidepressant medications:  amitriptyline , escitalopram  Current Anticonvulsant medications:  topiramate  twice daily Current Vitamins/Herbal/Supplements:  D Current Antihistamines/Decongestants:  no Current anti-CGRP:  Emgality  Other therapy:  no Birth control:  Mirena  Past NSAIDS:  Contraindicated (ulcer) Past analgesics:  Tylenol (ineffective), codeine (ineffective) Past abortive triptans:  Sumatriptan   (ineffective) Past muscle relaxants:  no Past anti-emetic:  no Past antihypertensive medications:  no Past antidepressant medications:  no Past anticonvulsant medications:  no Past vitamins/Herbal/Supplements:  no Past antihistamines/decongestants:  no Other past therapies:  no  Caffeine:  rarely Alcohol:  no Smoker:  no Diet:  Drinks water but not a lot. Exercise:  no Depression:  Yes but stable; Anxiety:  Yes.  She is a widow.  Other pain:  no Sleep hygiene:  Good with clonazepam Family history of headache:  Mom, father  PAST MEDICAL HISTORY: Past Medical History:  Diagnosis Date  . ADHD   . Anxiety   . Depression   . Headache   . OCD (obsessive compulsive disorder)   . Vitamin D deficiency     PAST SURGICAL HISTORY: History reviewed. No pertinent surgical history.  MEDICATIONS: Current Outpatient Medications on File Prior to Visit  Medication Sig Dispense Refill  . b complex vitamins capsule Take 1 capsule by mouth daily.    . Multiple Vitamin (MULTIVITAMIN WITH MINERALS) TABS tablet Take 1 tablet by mouth daily.    . clonazePAM (KLONOPIN) 1 MG tablet Take 1 mg by mouth 3 (three) times daily as needed for anxiety.    . DEXILANT 60 MG capsule 1 capsule daily.  0  . doxycycline (VIBRAMYCIN) 100 MG capsule 1 capsule daily.    . ergocalciferol (VITAMIN D2) 50000 units capsule Take 50,000 Units by mouth once a week.    . escitalopram (LEXAPRO) 10 MG tablet Take 10 mg by mouth daily.    Marland Kitchen levonorgestrel (MIRENA) 20 MCG/24HR IUD 1 each by Intrauterine route continuous.    . promethazine (PHENERGAN) 50 MG tablet Take 1 tablet (50 mg total) by mouth every 6 (six) hours as needed for nausea or vomiting. 30 tablet 1  . SUMAtriptan (IMITREX) 100 MG tablet May repeat once  in  2 hours if headache persists or recurs. Do not exceed 2 doses over 24 hours. (Patient not taking: Reported on 02/21/2018) 10 tablet 1  . topiramate (TOPAMAX) 25 MG tablet Take 25 mg by mouth 2 (two) times  daily.     No current facility-administered medications on file prior to visit.     ALLERGIES: Allergies  Allergen Reactions  . Penicillins Nausea And Vomiting    FAMILY HISTORY: Family History  Problem Relation Age of Onset  . Depression Mother   . Anxiety disorder Mother   . Migraines Mother   . Depression Father   . Anxiety disorder Father   . Depression Brother   . Anxiety disorder Brother     SOCIAL HISTORY: Social History   Socioeconomic History  . Marital status: Widowed    Spouse name: Not on file  . Number of children: 2  . Years of education: Not on file  . Highest education level: Some college, no degree  Occupational History  . Not on file  Social Needs  . Financial resource strain: Not on file  . Food insecurity:    Worry: Not on file    Inability: Not on file  . Transportation needs:    Medical: Not on file    Non-medical: Not on file  Tobacco Use  . Smoking status: Never Smoker  . Smokeless tobacco: Never Used  Substance and Sexual Activity  . Alcohol use: No    Frequency: Never  . Drug use: No  . Sexual activity: Not on file  Lifestyle  . Physical activity:    Days per week: Not on file    Minutes per session: Not on file  . Stress: Not on file  Relationships  . Social connections:    Talks on phone: Not on file    Gets together: Not on file    Attends religious service: Not on file    Active member of club or organization: Not on file    Attends meetings of clubs or organizations: Not on file    Relationship status: Not on file  . Intimate partner violence:    Fear of current or ex partner: Not on file    Emotionally abused: Not on file    Physically abused: Not on file    Forced sexual activity: Not on file  Other Topics Concern  . Not on file  Social History Narrative   Patient is right-handed. She rarely drinks caffeine. She does not regularly exercise.    REVIEW OF SYSTEMS: Constitutional: No fevers, chills, or sweats, no  generalized fatigue, change in appetite Eyes: No visual changes, double vision, eye pain Ear, nose and throat: No hearing loss, ear pain, nasal congestion, sore throat Cardiovascular: No chest pain, palpitations Respiratory:  No shortness of breath at rest or with exertion, wheezes GastrointestinaI: No nausea, vomiting, diarrhea, abdominal pain, fecal incontinence Genitourinary:  No dysuria, urinary retention or frequency Musculoskeletal:  No neck pain, back pain Integumentary: No rash, pruritus, skin lesions Neurological: as above Psychiatric: No depression, insomnia, anxiety Endocrine: No palpitations, fatigue, diaphoresis, mood swings, change in appetite, change in weight, increased thirst Hematologic/Lymphatic:  No purpura, petechiae. Allergic/Immunologic: no itchy/runny eyes, nasal congestion, recent allergic reactions, rashes  PHYSICAL EXAM: Vitals:   02/21/18 1516  BP: 100/72  Pulse: (!) 117  SpO2: 97%   General: No acute distress.  Patient appears well-groomed.  Head:  Normocephalic/atraumatic Eyes:  fundi examined but not visualized Neck: supple, no paraspinal tenderness, full range of motion  Back: No paraspinal tenderness Heart: regular rate and rhythm Lungs: Clear to auscultation bilaterally. Vascular: No carotid bruits. Neurological Exam: Mental status: alert and oriented to person, place, and time, recent and remote memory intact, fund of knowledge intact, attention and concentration intact, speech fluent and not dysarthric, language intact. Cranial nerves: CN I: not tested CN II: pupils equal, round and reactive to light, visual fields intact CN III, IV, VI:  full range of motion, no nystagmus, no ptosis CN V: facial sensation intact CN VII: upper and lower face symmetric CN VIII: hearing intact CN IX, X: gag intact, uvula midline CN XI: sternocleidomastoid and trapezius muscles intact CN XII: tongue midline Bulk & Tone: normal, no fasciculations. Motor:  5/5  throughout  Sensation: temperature and vibration sensation intact. Deep Tendon Reflexes:  2+ throughout, toes downgoing.  Finger to nose testing:  Without dysmetria.  Heel to shin:  Without dysmetria.  Gait:  Normal station and stride.  Able to turn and tandem walk. Romberg negative.  IMPRESSION: Initially, presentation consistent with chronic migraine without aura, intractable.  Now, her headaches are more consistent with intractable tension-type headaches.  Within a short period of time, she was started on three migraine preventatives, not all at optimal doses.  I favor discontinuing one of them.  She is told she has elevated liver enzymes which concerns her.  Therefore, we will discontinue amitriptyline as topiramate and Emgality do not affect the liver.  PLAN: 1.  Stop amitriptyline 2.  Continue Emgality monthly and topiramate  twice daily.  If headaches get worse, will increase topiramate. 3.  At earliest onset of headache, take rizatriptan .  May repeat dose once after 2 hours if needed.  Do not exceed 2 tablets in 24 hours. 4.  Limit use of pain relievers to no more than 2 days out of week to prevent rebound headache. 5.  Keep headache diary. 6. As she has no prior history of headaches and these are new frequent headaches, will check MRI of brain 6.  Follow up in 3 months.  Thank you for allowing me to take part in the care of this patient.  45 minutes spent face to face with patient, over 50% spent discussing diagnosis and management.  Shon Millet, DO  CC:  Dr. Wynelle Link

## 2018-02-21 NOTE — Patient Instructions (Addendum)
1.  Stop amitriptyline 2.  Continue Emgality monthly and topiramate  twice daily. 3.  At earliest onset of headache, take rizatriptan .  May repeat dose once after 2 hours if needed.  Do not exceed 2 tablets in 24 hours. 4.  Limit use of pain relievers to no more than 2 days out of week to prevent rebound headache. 5.  Keep headache diary. 6. We will get an MRI brain with and without contrast. We have sent a referral to [facility] for your MRI and they will call you directly to schedule your appt. They are located at [address] [facility]. If you need to contact them directly please call [phone]. 6.  Follow up in 3 months.  Migraine Headache A migraine headache is a very strong throbbing pain on one side or both sides of your head. Migraines can also cause other symptoms. Talk with your doctor about what things may bring on (trigger) your migraine headaches. Follow these instructions at home: Medicines  Take over-the-counter and prescription medicines only as told by your doctor.  Do not drive or use heavy machinery while taking prescription pain medicine.  To prevent or treat constipation while you are taking prescription pain medicine, your doctor may recommend that you: ? Drink enough fluid to keep your pee (urine) clear or pale yellow. ? Take over-the-counter or prescription medicines. ? Eat foods that are high in fiber. These include fresh fruits and vegetables, whole grains, and beans. ? Limit foods that are high in fat and processed sugars. These include fried and sweet foods. Lifestyle  Avoid alcohol.  Do not use any products that contain nicotine or tobacco, such as cigarettes and e-cigarettes. If you need help quitting, ask your doctor.  Get at least 8 hours of sleep every night.  Limit your stress. General instructions   Keep a journal to find out what may bring on your migraines. For example, write down: ? What you eat and drink. ? How much sleep  you get. ? Any change in what you eat or drink. ? Any change in your medicines.  If you have a migraine: ? Avoid things that make your symptoms worse, such as bright lights. ? It may help to lie down in a dark, quiet room. ? Do not drive or use heavy machinery. ? Ask your doctor what activities are safe for you.  Keep all follow-up visits as told by your doctor. This is important. Contact a doctor if:  You get a migraine that is different or worse than your usual migraines. Get help right away if:  Your migraine gets very bad.  You have a fever.  You have a stiff neck.  You have trouble seeing.  Your muscles feel weak or like you cannot control them.  You start to lose your balance a lot.  You start to have trouble walking.  You pass out (faint). This information is not intended to replace advice given to you by your health care provider. Make sure you discuss any questions you have with your health care provider. Document Released: 06/26/2008 Document Revised: 04/06/2016 Document Reviewed: 03/05/2016 Elsevier Interactive Patient Education  2018 [employer].

## 2018-03-04 ENCOUNTER — Telehealth: Payer: Self-pay | Admitting: Neurology

## 2018-03-04 NOTE — Telephone Encounter (Signed)
Pt left a VM message asking for a call back from Dr Moises BloodJaffe's nurse regarding a prescription but did not leave the name

## 2018-03-04 NOTE — Telephone Encounter (Signed)
Called and spoke with Pt. She wants to increase her topiramate.

## 2018-03-04 NOTE — Telephone Encounter (Signed)
Increase topiramate to 25mg  in AM and 50mg  at bedtime for 1 week, then 50mg  twice daily.

## 2018-03-04 NOTE — Progress Notes (Signed)
Pt called after hours. She was seen in the office on 02/21/18. She was instructed to use rizatriptan 10 mg. Pt checked with pharmacy, it was not sent in. #9 were called in, no refills

## 2018-03-04 NOTE — Telephone Encounter (Signed)
Pt called again and wanted to let Dr Everlena CooperJaffe know she has a really bad headache and that he decreased her medication and needed a call back

## 2018-03-05 MED ORDER — TOPIRAMATE 25 MG PO TABS: 50.0000 mg | ORAL_TABLET | Freq: Two times a day (BID) | ORAL | 0 refills | Status: DC

## 2018-03-05 NOTE — Telephone Encounter (Signed)
Called and spoke with Pt. She will cal me next month when time to refill topiramate and will change to the 50 mg BID then.

## 2018-03-14 ENCOUNTER — Telehealth: Payer: Self-pay | Admitting: Neurology

## 2018-03-14 NOTE — Telephone Encounter (Signed)
Patient can't take these OTC meds due to ulcers.  Tylenol does not help either.  Please advise.

## 2018-03-14 NOTE — Telephone Encounter (Signed)
I do not recommend Fioricet for frequent headaches.  Over the counter medications are best for tension type headaches (Excedrin, Advil, Aleve, etc).  She shouldn't be taking pain relievers more than 2 days out of the week anyway.

## 2018-03-14 NOTE — Telephone Encounter (Signed)
Patient given instructions per Dr. Jaffe. 

## 2018-03-14 NOTE — Telephone Encounter (Signed)
Then she can have the Fioricet, but no more than 10 tablets per 30 day prescription

## 2018-03-14 NOTE — Telephone Encounter (Signed)
Please advise 

## 2018-03-17 ENCOUNTER — Encounter (HOSPITAL_COMMUNITY): Payer: Self-pay | Admitting: Emergency Medicine

## 2018-03-17 ENCOUNTER — Other Ambulatory Visit: Payer: Self-pay

## 2018-03-17 DIAGNOSIS — G44229 Chronic tension-type headache, not intractable: Secondary | ICD-10-CM | POA: Insufficient documentation

## 2018-03-17 DIAGNOSIS — R51 Headache: Secondary | ICD-10-CM | POA: Diagnosis present

## 2018-03-17 DIAGNOSIS — Z79899 Other long term (current) drug therapy: Secondary | ICD-10-CM | POA: Diagnosis not present

## 2018-03-17 LAB — COMPREHENSIVE METABOLIC PANEL
ALBUMIN: 3.8 g/dL (ref 3.5–5.0)
ALK PHOS: 59 U/L (ref 38–126)
ALT: 19 U/L (ref 14–54)
AST: 18 U/L (ref 15–41)
Anion gap: 8 (ref 5–15)
BILIRUBIN TOTAL: 0.4 mg/dL (ref 0.3–1.2)
BUN: 24 mg/dL — AB (ref 6–20)
CALCIUM: 9.4 mg/dL (ref 8.9–10.3)
CO2: 24 mmol/L (ref 22–32)
Chloride: 106 mmol/L (ref 101–111)
Creatinine, Ser: 1.05 mg/dL — ABNORMAL HIGH (ref 0.44–1.00)
GFR calc Af Amer: >60 mL/min (ref >60)
GFR calc non Af Amer: >60 mL/min (ref >60)
Glucose, Bld: 93 mg/dL (ref 65–99)
POTASSIUM: 4 mmol/L (ref 3.5–5.1)
SODIUM: 138 mmol/L (ref 135–145)
TOTAL PROTEIN: 6.5 g/dL (ref 6.5–8.1)

## 2018-03-17 LAB — URINALYSIS, ROUTINE W REFLEX MICROSCOPIC
Bilirubin Urine: NEGATIVE
Glucose, UA: NEGATIVE mg/dL
Ketones, ur: NEGATIVE mg/dL
Leukocytes, UA: NEGATIVE
Nitrite: NEGATIVE
PROTEIN: NEGATIVE mg/dL
SPECIFIC GRAVITY, URINE: 1.01 (ref 1.005–1.030)
pH: 6 (ref 5.0–8.0)

## 2018-03-17 LAB — CBC
HEMATOCRIT: 39.5 % (ref 36.0–46.0)
Hemoglobin: 13.3 g/dL (ref 12.0–15.0)
MCH: 32.4 pg (ref 26.0–34.0)
MCHC: 33.7 g/dL (ref 30.0–36.0)
MCV: 96.1 fL (ref 78.0–100.0)
Platelets: 203 10*3/uL (ref 150–400)
RBC: 4.11 MIL/uL (ref 3.87–5.11)
RDW: 12.6 % (ref 11.5–15.5)
WBC: 6.5 10*3/uL (ref 4.0–10.5)

## 2018-03-17 LAB — I-STAT BETA HCG BLOOD, ED (MC, WL, AP ONLY)

## 2018-03-17 LAB — LIPASE, BLOOD: Lipase: 44 U/L (ref 11–51)

## 2018-03-17 MED ORDER — ONDANSETRON 4 MG PO TBDP
4.0000 mg | ORAL_TABLET | Freq: Once | ORAL | Status: DC | PRN
  Filled 2018-03-17: qty 1

## 2018-03-17 NOTE — ED Triage Notes (Signed)
Pt reports having tension headache for the last several days and blurred vision that started today along with nausea.

## 2018-03-18 ENCOUNTER — Ambulatory Visit: Payer: Medicaid Other

## 2018-03-18 ENCOUNTER — Telehealth: Payer: Self-pay | Admitting: Neurology

## 2018-03-18 ENCOUNTER — Emergency Department (HOSPITAL_COMMUNITY): Payer: Medicaid Other

## 2018-03-18 ENCOUNTER — Emergency Department (HOSPITAL_COMMUNITY)
Admission: EM | Admit: 2018-03-18 | Discharge: 2018-03-18 | Disposition: A | Discharge status: Home / Self Care | Payer: Medicaid Other | Attending: Emergency Medicine | Admitting: Emergency Medicine

## 2018-03-18 DIAGNOSIS — G43719 Chronic migraine without aura, intractable, without status migrainosus: Secondary | ICD-10-CM

## 2018-03-18 DIAGNOSIS — G44229 Chronic tension-type headache, not intractable: Secondary | ICD-10-CM

## 2018-03-18 MED ORDER — DIPHENHYDRAMINE HCL 50 MG/ML IJ SOLN
25.0000 mg | Freq: Once | INTRAMUSCULAR | Status: DC
  Filled 2018-03-18: qty 1

## 2018-03-18 MED ORDER — KETOROLAC TROMETHAMINE 30 MG/ML IJ SOLN
30.0000 mg | Freq: Once | INTRAMUSCULAR | Status: AC
  Administered 2018-03-18: 30 mg via INTRAVENOUS
  Filled 2018-03-18: qty 1

## 2018-03-18 MED ORDER — DEXAMETHASONE SODIUM PHOSPHATE 10 MG/ML IJ SOLN
10.0000 mg | Freq: Once | INTRAMUSCULAR | Status: AC
  Administered 2018-03-18: 10 mg via INTRAVENOUS
  Filled 2018-03-18: qty 1

## 2018-03-18 MED ORDER — GALCANEZUMAB-GNLM 120 MG/ML ~~LOC~~ SOAJ: 120.0000 mg | Freq: Once | SUBCUTANEOUS | 0 refills | Status: AC

## 2018-03-18 MED ORDER — METOCLOPRAMIDE HCL 5 MG/ML IJ SOLN
10.0000 mg | Freq: Once | INTRAMUSCULAR | Status: AC
Start: 2018-03-18 — End: 2018-03-18
  Administered 2018-03-18: 10 mg via INTRAVENOUS
  Filled 2018-03-18: qty 2

## 2018-03-18 MED ORDER — SODIUM CHLORIDE 0.9 % IV BOLUS
1000.0000 mL | Freq: Once | INTRAVENOUS | Status: AC
  Administered 2018-03-18: 1000 mL via INTRAVENOUS

## 2018-03-18 NOTE — Telephone Encounter (Signed)
Please call.

## 2018-03-18 NOTE — ED Provider Notes (Signed)
[facility]-EMERGENCY DEPT Provider Note   CSN: [id number] Arrival date & time: 03/17/18  1946     History   Chief Complaint Chief Complaint  Patient presents with  . Migraine  . Blurred Vision  . Nausea    HPI Diana Goodman is a 42 y.o. female.  Patient presents with "tension and migraine headaches".  States she has had headaches for the past 5 to 6 months and has seen both her PCP and neurology.  Her PCP told her that she likely had migraine headaches and started her on Topamax and Emgality. she later saw neurology who thought her headaches were more.  Like tension.  She denies any sudden worsening of her headaches.  She says she has headaches that started her forehead and radiates to her bilateral temples and eyes.  Has had some blurry vision and nausea but no vomiting.  No fever.  No focal weakness, numbness or tingling.  No bowel or bladder incontinence.  She has seen her PCP multiple times this week and gotten unknown injections for her headaches and was referred to the ED.  She denies any falls or trauma.  She has been scheduled for an MRI of her brain in the near future.  The history is provided by the patient.  Migraine  Associated symptoms include headaches. Pertinent negatives include no chest pain, no abdominal pain and no shortness of breath.    Past Medical History:  Diagnosis Date  . ADHD   . Anxiety   . Depression   . Headache   . OCD (obsessive compulsive disorder)   . Vitamin D deficiency     Patient Active Problem List   Diagnosis Date Noted  . Intractable episodic cluster headache 12/04/2017  . Intractable chronic migraine without aura and without status migrainosus 12/04/2017  . Severe episode of recurrent major depressive disorder, without psychotic features (HCC) 12/04/2017  . Non compliance with medical treatment 12/04/2017    History reviewed. No pertinent surgical history.   OB History   None      Home Medications      Prior to Admission medications   Medication Sig Start Date End Date Taking? Authorizing Provider  b complex vitamins capsule Take 1 capsule by mouth daily.    [provider]  clonazePAM (KLONOPIN) 1 MG tablet Take 1 mg by mouth 3 (three) times daily as needed for anxiety.    [provider]  DEXILANT 60 MG capsule 1 capsule daily. 11/19/17   [provider]  doxycycline (VIBRAMYCIN) 100 MG capsule 1 capsule daily.    [provider]  ergocalciferol (VITAMIN D2) 50000 units capsule Take 50,000 Units by mouth once a week.    [provider]  escitalopram (LEXAPRO) 10 MG tablet Take 10 mg by mouth daily.    [provider]  levonorgestrel (MIRENA) 20 MCG/24HR IUD 1 each by Intrauterine route continuous.    [provider]  Multiple Vitamin (MULTIVITAMIN WITH MINERALS) TABS tablet Take 1 tablet by mouth daily.    [provider]  promethazine (PHENERGAN) 50 MG tablet Take 1 tablet (50 mg total) by mouth every 6 (six) hours as needed for nausea or vomiting. 12/04/17   Dohmeier, Porfirio Mylararmen, MD  SUMAtriptan (IMITREX) 100 MG tablet May repeat once  in 2 hours if headache persists or recurs. Do not exceed 2 doses over 24 hours. Patient not taking: Reported on 02/21/2018 12/04/17   Dohmeier, Porfirio Mylararmen, MD  topiramate (TOPAMAX) 25 MG tablet Take  25 mg by mouth 2 (two) times daily.    [provider]  topiramate (TOPAMAX) 25 MG tablet Take 2 tablets (50 mg total) by mouth 2 (two) times daily. 03/05/18   Drema Dallas, DO    Family History Family History  Problem Relation Age of Onset  . Depression Mother   . Anxiety disorder Mother   . Migraines Mother   . Depression Father   . Anxiety disorder Father   . Depression Brother   . Anxiety disorder Brother     Social History Social History   Tobacco Use  . Smoking status: Never Smoker  . Smokeless tobacco: Never Used  Substance Use Topics  . Alcohol use: No    Frequency:  Never  . Drug use: No     Allergies   Penicillins   Review of Systems Review of Systems  Constitutional: Negative for activity change, appetite change and fever.  HENT: Negative for congestion.   Eyes: Positive for photophobia.  Respiratory: Negative for cough, chest tightness and shortness of breath.   Cardiovascular: Negative for chest pain and leg swelling.  Gastrointestinal: Positive for nausea. Negative for abdominal pain and vomiting.  Genitourinary: Negative for dysuria and hematuria.  Musculoskeletal: Negative for arthralgias, myalgias, neck pain and neck stiffness.  Neurological: Positive for headaches. Negative for dizziness and weakness.   all other systems are negative except as noted in the HPI and PMH.     Physical Exam Updated Vital Signs BP 106/78 (BP Location: Left Arm)   Pulse 87   Temp 98.7 F (37.1 C) (Oral)   Resp 16   Ht 5\' 4"  (1.626 m)   Wt 60.8 kg (134 lb)   SpO2 100%   BMI 23.00 kg/m   Physical Exam  Constitutional: She is oriented to person, place, and time. She appears well-developed and well-nourished. No distress.  Tearful and anxious  HENT:  Head: Normocephalic and atraumatic.  Mouth/Throat: Oropharynx is clear and moist. No oropharyngeal exudate.  Eyes: Pupils are equal, round, and reactive to light. Conjunctivae and EOM are normal.  Neck: Normal range of motion. Neck supple.  No meningismus.  Cardiovascular: Normal rate, regular rhythm, normal heart sounds and intact distal pulses.  No murmur heard. Pulmonary/Chest: Effort normal and breath sounds normal. No respiratory distress.  Abdominal: Soft. There is no tenderness. There is no rebound and no guarding.  Musculoskeletal: Normal range of motion. She exhibits no edema or tenderness.  Neurological: She is alert and oriented to person, place, and time. No cranial nerve deficit. She exhibits normal muscle tone. Coordination normal.  CN 2-12 intact, no ataxia on finger to nose, no  nystagmus, 5/5 strength throughout, no pronator drift, Romberg negative, normal gait.   Skin: Skin is warm.  Psychiatric: She has a normal mood and affect. Her behavior is normal.  Nursing note and vitals reviewed.    ED Treatments / Results  Labs (all labs ordered are listed, but only abnormal results are displayed) Labs Reviewed  COMPREHENSIVE METABOLIC PANEL - Abnormal; Notable for the following components:      Result Value   BUN 24 (*)    Creatinine, Ser 1.05 (*)    All other components within normal limits  URINALYSIS, ROUTINE W REFLEX MICROSCOPIC - Abnormal; Notable for the following components:   Color, Urine STRAW (*)    Hgb urine dipstick SMALL (*)    Bacteria, UA FEW (*)    All other components within normal limits  LIPASE, BLOOD  CBC  I-STAT BETA HCG BLOOD, ED ([facility], [facility], [facility] ONLY)    EKG None  Radiology Ct Head Wo Contrast  Result Date: 03/18/2018 CLINICAL DATA:  Tension headache for several days. Blurred vision for 1 day. EXAM: CT HEAD WITHOUT CONTRAST TECHNIQUE: Contiguous axial images were obtained from the base of the skull through the vertex without intravenous contrast. COMPARISON:  None. FINDINGS: Brain: No evidence of acute infarction, hemorrhage, hydrocephalus, extra-axial collection or mass lesion/mass effect. Vascular: No hyperdense vessel or unexpected calcification. Skull: Normal. Negative for fracture or focal lesion. Sinuses/Orbits: No acute finding. Other: None. IMPRESSION: No acute intracranial abnormalities. Electronically Signed   By: Burman Nieves M.D.   On: 03/18/2018 03:32    Procedures Procedures (including critical care time)  Medications Ordered in ED Medications  ondansetron (ZOFRAN-ODT) disintegrating tablet 4 mg (has no administration in time range)  sodium chloride 0.9 % bolus 1,000 mL (has no administration in time range)  ketorolac (TORADOL) 30 MG/ML injection 30 mg (has no administration in time range)  metoCLOPramide (REGLAN)  injection 10 mg (has no administration in time range)  diphenhydrAMINE (BENADRYL) injection 25 mg (has no administration in time range)  dexamethasone (DECADRON) injection 10 mg (has no administration in time range)     Initial Impression / Assessment and Plan / ED Course  I have reviewed the triage vital signs and the nursing notes.  Pertinent labs & imaging results that were available during my care of the patient were reviewed by me and considered in my medical decision making (see chart for details).    Patient with chronic headaches for the past 5 to 6 months.  She has been seen both her PCP and neurology.  Denies sudden worsening of her headache today.  Nonfocal neurological exam.  No fever.  Neurology Dr. Moises Blood records reviewed.   CT head today is negative.  Patient drove herself to the ED with her two small children.  Understands that she cannot receive any sedating medications.  She is given Toradol, Reglan and Decadron and IVF.  Low suspicion for meningitis, temporal arteritis, subarachnoid hemorrhage.  She reports improvement of her headache after these measures.  She is frustrated because she feels that her neurologist and PCP are not helping her with her headaches.  Discussed that the emergency department's role is to rule out life-threatening emergencies and there is no evidence of hemorrhage, stroke, temporal arteritis, meningitis or other serious cause of headache.  After treatment, patient feels better and is requesting to go home.  She is advised to call her neurologist today.  Return precautions discussed including sudden worsening of the headache, unilateral weakness, numbness, fever or any other concerns. Final Clinical Impressions(s) / ED Diagnoses   Final diagnoses:  Chronic tension-type headache, not intractable    ED Discharge Orders    None       Star Resler, Jeannett Senior, MD 03/18/18 [phone]

## 2018-03-18 NOTE — Telephone Encounter (Signed)
Patient returning your call.  Thanks.

## 2018-03-18 NOTE — Telephone Encounter (Signed)
See previous encounter from 6/14

## 2018-03-18 NOTE — Telephone Encounter (Signed)
Pt rtrnd call. She was in ER last night, was given Cocktail without benadryl, she did not have a driver. She states it helped her neck pain, but not her headache. She took rizatriptan while on the telephone with me. We went over her medications, and I reiterated to only take abortives 2 x a week. I reminded her to give the topiramate time to work. She wanted to increase, topiramate, I advised her will need to give the dose we just went up to at least 30 days.  Pt states also, it is time for her Emgality injection. It is actually 11 days past the time for her shot. Her insurance was covering it, but now requires PA. Her PCP has not completed that. I advised her that to help her until a PA is completed, I can give her a sample of Emgality. Pt requests I give her the injection here in the office.   EAV#[id number] D EXP: 03 2020

## 2018-03-18 NOTE — Discharge Instructions (Addendum)
Your CT scan is normal.  Follow-up with your primary doctor and neurologist.  Return to the ED if your headache suddenly worsens, is associated with weakness, numbness, tingling, fever or any other concerns.

## 2018-03-18 NOTE — Telephone Encounter (Signed)
Called Pt, LMOVM for return call 

## 2018-03-22 ENCOUNTER — Ambulatory Visit
Admission: RE | Admit: 2018-03-22 | Discharge: 2018-03-22 | Disposition: A | Discharge status: Home / Self Care | Payer: Medicaid Other | Source: Ambulatory Visit | Attending: Neurology | Admitting: Neurology

## 2018-03-22 DIAGNOSIS — R51 Headache: Principal | ICD-10-CM

## 2018-03-22 DIAGNOSIS — R519 Headache, unspecified: Secondary | ICD-10-CM

## 2018-03-22 MED ORDER — GADOBENATE DIMEGLUMINE 529 MG/ML IV SOLN
12.0000 mL | Freq: Once | INTRAVENOUS | Status: AC | PRN
  Administered 2018-03-22: 12 mL via INTRAVENOUS

## 2018-03-26 ENCOUNTER — Telehealth: Payer: Self-pay

## 2018-03-26 NOTE — Telephone Encounter (Signed)
Called and spoke to Pt, advised her of MRI results

## 2018-03-26 NOTE — Telephone Encounter (Signed)
-----   Message from Octaviano Battyebecca S Tat, DO sent at 03/24/2018  7:34 AM EDT ----- You can let pt know that MRI brain is normal

## 2018-04-07 ENCOUNTER — Other Ambulatory Visit: Payer: Self-pay | Admitting: Neurology

## 2018-04-09 ENCOUNTER — Other Ambulatory Visit: Payer: Self-pay

## 2018-04-09 DIAGNOSIS — G43719 Chronic migraine without aura, intractable, without status migrainosus: Secondary | ICD-10-CM

## 2018-04-09 MED ORDER — RIZATRIPTAN BENZOATE 10 MG PO TBDP: 10.0000 mg | ORAL_TABLET | ORAL | 3 refills | Status: DC | PRN

## 2018-04-14 ENCOUNTER — Ambulatory Visit: Payer: Self-pay | Admitting: Neurology

## 2018-04-14 ENCOUNTER — Encounter

## 2018-05-29 NOTE — Progress Notes (Signed)
NEUROLOGY FOLLOW UP OFFICE NOTE  Diana Goodman [id number]  HISTORY OF PRESENT ILLNESS: Diana Goodman is a 42 year old right-handed female with depression/anxiety and elevated liver enzymes who follow up for chronic migraine and tension headaches.  UPDATE: MRI of brain with and without contrast from 03/23/18 was personally reviewed and is normal.   She has been to the ED twice in June.  One time, she was 11 days past due for her Emgality shot.  She stopped topiramate because she said it caused jaw pain.  Rizatriptan doesn't work.  A Red Bull helps.  She wants to get [facility] because she thinks it is causing the tension headaches.  She reports daily tension type headache  She reports bifrontal/temporal/occipital radiating to back of neck.  It is severe and constant.  She has not had migraines. Frequency of abortive medication: She had been taking Fioricet twice a week.  Ineffective. Current NSAIDS:  Contraindicated (gastric ulcers) Current analgesics:  Fioricet, Tylenol (once in awhile, overall ineffective) Current triptans:  no Current ergotamine:  no Current anti-emetic:  Promethazine 25mg  Current muscle relaxants:  no Current anti-anxiolytic:  clonazepam Current sleep aide:  clonazepam Current Antihypertensive medications:  no Current Antidepressant medications:  escitalopram 20mg  Current Anticonvulsant medications:  no Current anti-CGRP:  Emgality Current Vitamins/Herbal/Supplements:  D Current Antihistamines/Decongestants:  ranaditine Other therapy:  Peppermint oil, ice pack Hormone/birth control:  Mirena  Caffeine:  rarely Alcohol:  no Smoker:  no Diet:  She drinks water but doesn't drink enough. Exercise:  no Depression:  Yes; Anxiety:  yes Other pain:  no Sleep hygiene:  Good with clonazepam    HISTORY: Onset:  January 2019 Location:  Bifrontal/temporal, sometimes occipital Quality:  Initially pounding, now non-throbbing pressure Initial intensity:  Moderate.   She denies new headache, thunderclap headache or severe headache that wakes her from sleep. Aura:  no Prodrome:  no Postdrome:  no Associated symptoms:  Initially nausea, blurred vision, photophobia, phonophobia.  Now, she denies any associated symptoms.  She denies associated unilateral numbness or weakness. Initial Duration:  They were initially constant, but then became episodic, lasting up to 10 days.   Initial Frequency:  Unclear.   Initial Frequency of abortive medication:  No medication Triggers/aggravating factors:  Emotional stress Relieving factors:  Applying pressure to her temples Activity:  aggravates  Past NSAIDS:  Contraindicated (ulcer) Past analgesics:  Tylenol (ineffective), codeine (ineffective) Past abortive triptans:  Sumatriptan 100mg  (ineffective), rizatriptan 10mg  Past muscle relaxants:  no Past anti-emetic:  no Past antihypertensive medications:  no Past antidepressant medications:  amitriptyline Past anticonvulsant medications:  topiramate 50mg  twice daily Past vitamins/Herbal/Supplements:  no Past antihistamines/decongestants:  no Other past therapies:  no  Family history of headaches:  Mother, father  PAST MEDICAL HISTORY: Past Medical History:  Diagnosis Date  . ADHD   . Anxiety   . Depression   . Headache   . OCD (obsessive compulsive disorder)   . Vitamin D deficiency     MEDICATIONS: Current Outpatient Medications on File Prior to Visit  Medication Sig Dispense Refill  . b complex vitamins capsule Take 1 capsule by mouth daily.    . clonazePAM (KLONOPIN) 1 MG tablet Take 1 mg by mouth 3 (three) times daily as needed for anxiety.    . DEXILANT 60 MG capsule 1 capsule daily.  0  . doxycycline (VIBRAMYCIN) 100 MG capsule 1 capsule daily.    . ergocalciferol (VITAMIN D2) 50000 units capsule Take 50,000 Units by mouth once a  week.    . escitalopram (LEXAPRO) 10 MG tablet Take 10 mg by mouth daily.    Marland Kitchen. levonorgestrel (MIRENA) 20 MCG/24HR IUD 1  each by Intrauterine route continuous.    . Multiple Vitamin (MULTIVITAMIN WITH MINERALS) TABS tablet Take 1 tablet by mouth daily.    . promethazine (PHENERGAN) 50 MG tablet Take 1 tablet (50 mg total) by mouth every 6 (six) hours as needed for nausea or vomiting. 30 tablet 1  . rizatriptan (MAXALT-MLT) 10 MG disintegrating tablet Take 1 tablet (10 mg total) by mouth as needed for migraine. May repeat in 2 hours if needed 9 tablet 3  . SUMAtriptan (IMITREX) 100 MG tablet May repeat once  in 2 hours if headache persists or recurs. Do not exceed 2 doses over 24 hours. (Patient not taking: Reported on 02/21/2018) 10 tablet 1  . topiramate (TOPAMAX) 25 MG tablet Take 25 mg by mouth 2 (two) times daily.    Marland Kitchen. topiramate (TOPAMAX) 25 MG tablet TAKE 2 TABLETS(50 MG) BY MOUTH TWICE DAILY 60 tablet 0   No current facility-administered medications on file prior to visit.     ALLERGIES: Allergies  Allergen Reactions  . Penicillins Nausea And Vomiting    FAMILY HISTORY: Family History  Problem Relation Age of Onset  . Depression Mother   . Anxiety disorder Mother   . Migraines Mother   . Depression Father   . Anxiety disorder Father   . Depression Brother   . Anxiety disorder Brother    SOCIAL HISTORY: Social History   Socioeconomic History  . Marital status: Widowed    Spouse name: Not on file  . Number of children: 2  . Years of education: Not on file  . Highest education level: Some college, no degree  Occupational History  . Not on file  Social Needs  . Financial resource strain: Not on file  . Food insecurity:    Worry: Not on file    Inability: Not on file  . Transportation needs:    Medical: Not on file    Non-medical: Not on file  Tobacco Use  . Smoking status: Never Smoker  . Smokeless tobacco: Never Used  Substance and Sexual Activity  . Alcohol use: No    Frequency: Never  . Drug use: No  . Sexual activity: Not on file  Lifestyle  . Physical activity:    Days  per week: Not on file    Minutes per session: Not on file  . Stress: Not on file  Relationships  . Social connections:    Talks on phone: Not on file    Gets together: Not on file    Attends religious service: Not on file    Active member of club or organization: Not on file    Attends meetings of clubs or organizations: Not on file    Relationship status: Not on file  . Intimate partner violence:    Fear of current or ex partner: Not on file    Emotionally abused: Not on file    Physically abused: Not on file    Forced sexual activity: Not on file  Other Topics Concern  . Not on file  Social History Narrative   Patient is right-handed. She rarely drinks caffeine. She does not regularly exercise.    REVIEW OF SYSTEMS: Constitutional: No fevers, chills, or sweats, no generalized fatigue, change in appetite Eyes: No visual changes, double vision, eye pain Ear, nose and throat: No hearing loss, ear pain,  nasal congestion, sore throat Cardiovascular: No chest pain, palpitations Respiratory:  No shortness of breath at rest or with exertion, wheezes GastrointestinaI: No nausea, vomiting, diarrhea, abdominal pain, fecal incontinence Genitourinary:  No dysuria, urinary retention or frequency Musculoskeletal:  No neck pain, back pain Integumentary: No rash, pruritus, skin lesions Neurological: as above Psychiatric: No depression, insomnia, anxiety Endocrine: No palpitations, fatigue, diaphoresis, mood swings, change in appetite, change in weight, increased thirst Hematologic/Lymphatic:  No purpura, petechiae. Allergic/Immunologic: no itchy/runny eyes, nasal congestion, recent allergic reactions, rashes  PHYSICAL EXAM: Blood pressure 96/60, pulse 90, height 5\' 4"  (1.626 m), weight 130 lb (59 kg), SpO2 94 %. General: No acute distress.  Patient appears well-groomed.   Head:  Normocephalic/atraumatic Eyes:  Fundi examined but not visualized Neck: supple, no paraspinal tenderness, full  range of motion Heart:  Regular rate and rhythm Lungs:  Clear to auscultation bilaterally Back: No paraspinal tenderness Neurological Exam: alert and oriented to person, place, and time. Attention span and concentration intact, recent and remote memory intact, fund of knowledge intact.  Speech fluent and not dysarthric, language intact.  CN II-XII intact. Bulk and tone normal, muscle strength 5/5 throughout.  Sensation to light touch  intact.  Deep tendon reflexes 2+ throughout.  Finger to nose testing intact.  Gait normal, Romberg negative.  IMPRESSION: Chronic tension-type headaches.  Migraines have pretty much resolved, however she would rather discontinue the Emgality.  Instead, she plans on getting a Daith piercing.  PLAN: 1.  We will start tizanidine 2mg , titrating weekly from 2mg  at bedtime to 2mg  three times daily.  If headaches not improved after 4 weeks, we can further titrate to 4mg  three times daily.  I cautioned her about possible drowsiness.  If ineffective, consider starting nortriptyline. 2.  Stop Fioricet.  Instead, acetaminophen-caffeine pill for abortive therapy (with or without a strong cup of black coffee).  Explained that abortive medication may not fully be effective while she has a persistent headache. 3.  Limit use of pain relievers to no more than 2 days out of week to prevent rebound headache 4.  Keep headache diary 5.  Increase water intake. 6.  Follow up in 4 months.  Shon Millet, DO

## 2018-05-30 ENCOUNTER — Ambulatory Visit (INDEPENDENT_AMBULATORY_CARE_PROVIDER_SITE_OTHER): Payer: Medicaid Other | Admitting: Neurology

## 2018-05-30 ENCOUNTER — Encounter: Payer: Self-pay | Admitting: Neurology

## 2018-05-30 ENCOUNTER — Encounter

## 2018-05-30 VITALS — BP 96/60 | HR 90 | Ht 64.0 in | Wt 130.0 lb

## 2018-05-30 DIAGNOSIS — G44221 Chronic tension-type headache, intractable: Secondary | ICD-10-CM

## 2018-05-30 MED ORDER — TIZANIDINE HCL 2 MG PO TABS: ORAL_TABLET | ORAL | 0 refills | Status: DC

## 2018-05-30 NOTE — Patient Instructions (Signed)
1.  We will start tizanidine 2mg  tablets.  Caution for drowsiness.  Take 1 tablet at bedtime for 7 days  Then 1 tablet in morning and 1 tablet at bedtime for 7 days  Then 1 tablet three times daily  If headaches persist after 4 weeks, contact me and we can increase dose 2.  Stop the butalbital-acetaminophen-caffeine pill.  Try taking acetaminophen-caffeine pill (Excedrin Tension) when you get a severe headache.  You may also take it with a strong cup of black coffee.  Limit use to no more than 2 days out of week to prevent rebound headache. 3.  Increase water intake 4.  Keep headache diary 5.  We will stop Emgality 6.  Follow up in 4 months.

## 2018-06-25 ENCOUNTER — Ambulatory Visit: Payer: Self-pay | Admitting: Neurology

## 2018-07-09 ENCOUNTER — Telehealth: Payer: Self-pay | Admitting: Neurology

## 2018-07-09 ENCOUNTER — Ambulatory Visit (INDEPENDENT_AMBULATORY_CARE_PROVIDER_SITE_OTHER): Payer: Medicaid Other | Admitting: *Deleted

## 2018-07-09 DIAGNOSIS — G43719 Chronic migraine without aura, intractable, without status migrainosus: Secondary | ICD-10-CM | POA: Diagnosis not present

## 2018-07-09 DIAGNOSIS — G44211 Episodic tension-type headache, intractable: Secondary | ICD-10-CM | POA: Diagnosis not present

## 2018-07-09 MED ORDER — KETOROLAC TROMETHAMINE 60 MG/2ML IM SOLN
60.0000 mg | Freq: Once | INTRAMUSCULAR | Status: AC
  Administered 2018-07-09: 60 mg via INTRAMUSCULAR

## 2018-07-09 NOTE — Telephone Encounter (Signed)
Attempted to call patient but mailbox is full.  

## 2018-07-09 NOTE — Telephone Encounter (Signed)
Called patient's mother and left message for her to try and reach patient for me.

## 2018-07-09 NOTE — Telephone Encounter (Signed)
Patient called and needs to speak with someone regarding her  Headaches. She said that she has had them for 8 months and nothing is seeming to help them. She said the muscle relaxer she was put on last time will help her neck and the back of her head but that her jaw, teeth and Front of head as well as Temples continue to hurt. She said "she cannot continue to live like this and having bad thoughts". She will be coming in today at 4:00PM for a Toradol Injection. Please Advise. Thanks

## 2018-07-09 NOTE — Telephone Encounter (Signed)
OK for injection.  She should be on tizanidine 4mg  three times daily.  If she is having "bad thoughts" then she should contact her PCP or go to the ED immediately.

## 2018-07-09 NOTE — Telephone Encounter (Signed)
Ok for injection?  Any other suggestions?

## 2018-07-14 ENCOUNTER — Telehealth: Payer: Self-pay | Admitting: Neurology

## 2018-07-14 NOTE — Telephone Encounter (Signed)
Patient called regarding her having a Toradol injection and it not helping. Please Call. Thanks

## 2018-07-15 NOTE — Telephone Encounter (Signed)
Have attempted to reach the Pt, no answer, her mailbox is full, unable to leave a message

## 2018-07-16 NOTE — Telephone Encounter (Signed)
Attempted to reach Pt, mailbox full, unable to leave message

## 2018-08-01 ENCOUNTER — Other Ambulatory Visit: Payer: Self-pay | Admitting: Physician Assistant

## 2018-08-01 MED ORDER — RISPERIDONE 3 MG PO TABS: 3.0000 mg | ORAL_TABLET | Freq: Every day | ORAL | 0 refills | Status: DC

## 2018-08-02 ENCOUNTER — Other Ambulatory Visit: Payer: Self-pay | Admitting: Neurology

## 2018-08-05 ENCOUNTER — Other Ambulatory Visit: Payer: Self-pay | Admitting: Physician Assistant

## 2018-08-05 MED ORDER — RISPERIDONE 3 MG PO TABS: 3.0000 mg | ORAL_TABLET | Freq: Every day | ORAL | 0 refills | Status: DC

## 2018-08-21 ENCOUNTER — Telehealth: Payer: Self-pay | Admitting: Neurology

## 2018-08-21 NOTE — Telephone Encounter (Signed)
Patient needs to talk to someone about her medication for nausea and caffeine medication

## 2018-08-22 ENCOUNTER — Ambulatory Visit: Payer: Self-pay | Admitting: General Surgery

## 2018-08-22 NOTE — Telephone Encounter (Signed)
Called and LMOVM for Pt to rtrn call

## 2018-08-27 NOTE — Pre-Procedure Instructions (Signed)
Diana Goodman  08/27/2018      [id number] - [place], [place] - [address] AT [facility] [address] [place] [address] Phone: [phone] Fax: [phone]  [facility] - [place], [place] - [address] [place] [address] Phone: [phone] Fax: [phone]    Your procedure is scheduled on Wed., Dec. 4, 2019 from 1:30PM-3:00PM  Report to [facility] Admitting Entrance "A" at 11:30AM  Call this number if you have problems the morning of surgery:  [phone]   Remember:  Do not eat after midnight on Dec. 3rd  You may drink clear liquids until 3 hours (10:30AM) prior to surgery .  Clear liquids allowed are:    Water, Juice (non-citric and without pulp), Carbonated beverages, Clear Tea, Black Coffee only, Plain Jell-O only, Gatorade and Plain Popsicles only    Take these medicines the morning of surgery with A SIP OF WATER: Escitalopram (LEXAPRO), Pantoprazole (PROTONIX), and Topiramate (TOPAMAX)  If needed: Acetaminophen (TYLENOL), Cetirizine (ZYRTEC), Ondansetron (ZOFRAN),  and TiZANidine (ZANAFLEX)   As of today, stop taking all Other Aspirin Products, Vitamins, Fish oils, and Herbal medications. Also stop all NSAIDS i.e. Advil, Ibuprofen, Motrin, Aleve, Anaprox, Naproxen, BC, Goody Powders, and all Supplements.   Do not wear jewelry, make-up or nail polish.  Do not wear lotions, powders, or perfumes, or deodorant.  Do not shave 48 hours prior to surgery.    Do not bring valuables to the hospital.  [facility] is not responsible for any belongings or valuables.  Contacts, dentures or bridgework may not be worn into surgery.  Leave your suitcase in the car.  After surgery it may be brought to your room.  For patients admitted to the hospital, discharge time will be determined by your treatment team.  Patients discharged the day of  surgery will not be allowed to drive home.   Special instructions:  [facility]- Preparing For Surgery  Before surgery, you can play an important role. Because skin is not sterile, your skin needs to be as free of germs as possible. You can reduce the number of germs on your skin by washing with CHG (chlorahexidine gluconate) Soap before surgery.  CHG is an antiseptic cleaner which kills germs and bonds with the skin to continue killing germs even after washing.    Oral Hygiene is also important to reduce your risk of infection.  Remember - BRUSH YOUR TEETH THE MORNING OF SURGERY WITH YOUR REGULAR TOOTHPASTE  Please do not use if you have an allergy to CHG or antibacterial soaps. If your skin becomes reddened/irritated stop using the CHG.  Do not shave (including legs and underarms) for at least 48 hours prior to first CHG shower. It is OK to shave your face.  Please follow these instructions carefully.   1. Shower the NIGHT BEFORE SURGERY and the MORNING OF SURGERY with CHG.   2. If you chose to wash your hair, wash your hair first as usual with your normal shampoo.  3. After you shampoo, rinse your hair and body thoroughly to remove the shampoo.  4. Use CHG as you would any other liquid soap. You can apply CHG directly to the skin and wash gently with a scrungie or a clean washcloth.   5. Apply the CHG Soap to your body ONLY FROM THE NECK DOWN.  Do not use on open wounds or open sores.  Avoid contact with your eyes, ears, mouth and genitals (private parts). Wash Face and genitals (private parts)  with your normal soap.  6. Wash thoroughly, paying special attention to the area where your surgery will be performed.  7. Thoroughly rinse your body with warm water from the neck down.  8. DO NOT shower/wash with your normal soap after using and rinsing off the CHG Soap.  9. Pat yourself dry with a CLEAN TOWEL.  10. Wear CLEAN PAJAMAS to bed the night before surgery, wear comfortable  clothes the morning of surgery  11. Place CLEAN SHEETS on your bed the night of your first shower and DO NOT SLEEP WITH PETS.  Day of Surgery:  Do not apply any deodorants/lotions.  Please wear clean clothes to the hospital/surgery center.   Remember to brush your teeth WITH YOUR REGULAR TOOTHPASTE.   Please read over the following fact sheets that you were given. Pain Booklet, Coughing and Deep Breathing and Surgical Site Infection Prevention

## 2018-08-29 ENCOUNTER — Other Ambulatory Visit: Payer: Self-pay

## 2018-08-29 ENCOUNTER — Encounter (HOSPITAL_COMMUNITY)
Admission: RE | Admit: 2018-08-29 | Discharge: 2018-08-29 | Disposition: A | Discharge status: Home / Self Care | Payer: Medicaid Other | Source: Ambulatory Visit | Attending: General Surgery | Admitting: General Surgery

## 2018-08-29 ENCOUNTER — Encounter (HOSPITAL_COMMUNITY): Payer: Self-pay

## 2018-08-29 DIAGNOSIS — Z01812 Encounter for preprocedural laboratory examination: Secondary | ICD-10-CM | POA: Diagnosis not present

## 2018-08-29 LAB — CBC
HCT: 43.1 % (ref 36.0–46.0)
Hemoglobin: 14.5 g/dL (ref 12.0–15.0)
MCH: 31.5 pg (ref 26.0–34.0)
MCHC: 33.6 g/dL (ref 30.0–36.0)
MCV: 93.7 fL (ref 80.0–100.0)
NRBC: 0 % (ref 0.0–0.2)
PLATELETS: 258 10*3/uL (ref 150–400)
RBC: 4.6 MIL/uL (ref 3.87–5.11)
RDW: 11.9 % (ref 11.5–15.5)
WBC: 5.8 10*3/uL (ref 4.0–10.5)

## 2018-08-29 NOTE — Progress Notes (Signed)
PCP - Cletis MediaMichael Carroll, PA-C Cardiologist - denies  Chest x-ray - N/A EKG - N/A Stress Test -denies  ECHO - denies Cardiac Cath - denies  Sleep Study - denies  Aspirin Instructions: patient advised to stop all ASA/NSAID's/Herbal medications and vitamins 7 days prior to surgery.  Anesthesia review: no  Patient denies shortness of breath, fever, cough and chest pain at PAT appointment   Patient verbalized understanding of instructions that were given to them at the PAT appointment. Patient was also instructed that they will need to review over the PAT instructions again at home before surgery.

## 2018-09-03 ENCOUNTER — Ambulatory Visit (HOSPITAL_COMMUNITY): Admission: RE | Admit: 2018-09-03 | Payer: Medicaid Other | Source: Ambulatory Visit | Admitting: General Surgery

## 2018-09-03 ENCOUNTER — Encounter (HOSPITAL_COMMUNITY): Admission: RE | Payer: Self-pay | Source: Ambulatory Visit

## 2018-09-03 SURGERY — LAPAROSCOPIC CHOLECYSTECTOMY WITH INTRAOPERATIVE CHOLANGIOGRAM
Anesthesia: General

## 2018-09-11 NOTE — Progress Notes (Signed)
Pt made aware to arrive at 5:30 A.M. tomorrow, Friday, 09/12/18. Pt stated that she has all of her instructions from her PAT appointment. Pt stated that she will not consume a clear diet as instructed after midnight until 0430 because she will be asleep. Pt verbalized understanding of all pre-op instructions.

## 2018-09-12 ENCOUNTER — Other Ambulatory Visit: Payer: Self-pay

## 2018-09-12 ENCOUNTER — Ambulatory Visit (HOSPITAL_COMMUNITY): Payer: Medicaid Other | Admitting: Certified Registered"

## 2018-09-12 ENCOUNTER — Ambulatory Visit (HOSPITAL_COMMUNITY): Payer: Medicaid Other

## 2018-09-12 ENCOUNTER — Encounter (HOSPITAL_COMMUNITY): Payer: Self-pay | Admitting: General Practice

## 2018-09-12 ENCOUNTER — Ambulatory Visit (HOSPITAL_COMMUNITY)
Admission: RE | Admit: 2018-09-12 | Discharge: 2018-09-12 | Disposition: A | Discharge status: Home / Self Care | Payer: Medicaid Other | Attending: General Surgery | Admitting: General Surgery

## 2018-09-12 ENCOUNTER — Encounter (HOSPITAL_COMMUNITY)
Admission: RE | Disposition: A | Discharge status: Home / Self Care | Payer: Self-pay | Source: Home / Self Care | Attending: General Surgery

## 2018-09-12 DIAGNOSIS — Z79899 Other long term (current) drug therapy: Secondary | ICD-10-CM | POA: Diagnosis not present

## 2018-09-12 DIAGNOSIS — J45909 Unspecified asthma, uncomplicated: Secondary | ICD-10-CM | POA: Diagnosis not present

## 2018-09-12 DIAGNOSIS — K8010 Calculus of gallbladder with chronic cholecystitis without obstruction: Secondary | ICD-10-CM | POA: Diagnosis not present

## 2018-09-12 DIAGNOSIS — Z8711 Personal history of peptic ulcer disease: Secondary | ICD-10-CM | POA: Insufficient documentation

## 2018-09-12 DIAGNOSIS — K219 Gastro-esophageal reflux disease without esophagitis: Secondary | ICD-10-CM | POA: Diagnosis not present

## 2018-09-12 DIAGNOSIS — F329 Major depressive disorder, single episode, unspecified: Secondary | ICD-10-CM | POA: Diagnosis not present

## 2018-09-12 DIAGNOSIS — Z419 Encounter for procedure for purposes other than remedying health state, unspecified: Secondary | ICD-10-CM

## 2018-09-12 HISTORY — PX: CHOLECYSTECTOMY: SHX55

## 2018-09-12 LAB — POCT PREGNANCY, URINE: Preg Test, Ur: NEGATIVE

## 2018-09-12 SURGERY — LAPAROSCOPIC CHOLECYSTECTOMY WITH INTRAOPERATIVE CHOLANGIOGRAM
Anesthesia: General

## 2018-09-12 MED ORDER — HYDROMORPHONE HCL 1 MG/ML IJ SOLN
0.2500 mg | INTRAMUSCULAR | Status: DC | PRN
  Administered 2018-09-12 (×2): 0.5 mg via INTRAVENOUS

## 2018-09-12 MED ORDER — PHENYLEPHRINE 40 MCG/ML (10ML) SYRINGE FOR IV PUSH (FOR BLOOD PRESSURE SUPPORT)
PREFILLED_SYRINGE | INTRAVENOUS | Status: AC
  Filled 2018-09-12: qty 10

## 2018-09-12 MED ORDER — ONDANSETRON HCL 4 MG/2ML IJ SOLN
INTRAMUSCULAR | Status: DC | PRN
  Administered 2018-09-12: 4 mg via INTRAVENOUS

## 2018-09-12 MED ORDER — SUGAMMADEX SODIUM 200 MG/2ML IV SOLN
INTRAVENOUS | Status: DC | PRN
  Administered 2018-09-12: 125 mg via INTRAVENOUS

## 2018-09-12 MED ORDER — IOPAMIDOL (ISOVUE-300) INJECTION 61%
INTRAVENOUS | Status: AC
  Filled 2018-09-12: qty 50

## 2018-09-12 MED ORDER — 0.9 % SODIUM CHLORIDE (POUR BTL) OPTIME
TOPICAL | Status: DC | PRN
  Administered 2018-09-12: 1000 mL

## 2018-09-12 MED ORDER — MEPERIDINE HCL 50 MG/ML IJ SOLN: 6.2500 mg | INTRAMUSCULAR | Status: DC | PRN

## 2018-09-12 MED ORDER — BUPIVACAINE-EPINEPHRINE 0.25% -1:200000 IJ SOLN
INTRAMUSCULAR | Status: DC | PRN
  Administered 2018-09-12: 18 mL

## 2018-09-12 MED ORDER — SODIUM CHLORIDE 0.9 % IR SOLN
Status: DC | PRN
  Administered 2018-09-12: 1

## 2018-09-12 MED ORDER — PHENYLEPHRINE 40 MCG/ML (10ML) SYRINGE FOR IV PUSH (FOR BLOOD PRESSURE SUPPORT)
PREFILLED_SYRINGE | INTRAVENOUS | Status: DC | PRN
  Administered 2018-09-12: 80 ug via INTRAVENOUS
  Administered 2018-09-12: 40 ug via INTRAVENOUS

## 2018-09-12 MED ORDER — PROPOFOL 10 MG/ML IV BOLUS
INTRAVENOUS | Status: AC
  Filled 2018-09-12: qty 20

## 2018-09-12 MED ORDER — EPHEDRINE SULFATE-NACL 50-0.9 MG/10ML-% IV SOSY
PREFILLED_SYRINGE | INTRAVENOUS | Status: DC | PRN
  Administered 2018-09-12: 10 mg via INTRAVENOUS

## 2018-09-12 MED ORDER — DEXAMETHASONE SODIUM PHOSPHATE 10 MG/ML IJ SOLN
INTRAMUSCULAR | Status: AC
  Filled 2018-09-12: qty 1

## 2018-09-12 MED ORDER — ROCURONIUM BROMIDE 50 MG/5ML IV SOSY
PREFILLED_SYRINGE | INTRAVENOUS | Status: AC
  Filled 2018-09-12: qty 5

## 2018-09-12 MED ORDER — BUPIVACAINE-EPINEPHRINE (PF) 0.25% -1:200000 IJ SOLN
INTRAMUSCULAR | Status: AC
  Filled 2018-09-12: qty 30

## 2018-09-12 MED ORDER — ONDANSETRON HCL 4 MG/2ML IJ SOLN
INTRAMUSCULAR | Status: AC
  Filled 2018-09-12: qty 2

## 2018-09-12 MED ORDER — CHLORHEXIDINE GLUCONATE CLOTH 2 % EX PADS: 6.0000 | MEDICATED_PAD | Freq: Once | CUTANEOUS | Status: DC

## 2018-09-12 MED ORDER — ONDANSETRON HCL 4 MG/2ML IJ SOLN: 4.0000 mg | Freq: Once | INTRAMUSCULAR | Status: DC | PRN

## 2018-09-12 MED ORDER — PROPOFOL 10 MG/ML IV BOLUS
INTRAVENOUS | Status: DC | PRN
  Administered 2018-09-12: 120 mg via INTRAVENOUS

## 2018-09-12 MED ORDER — DEXAMETHASONE SODIUM PHOSPHATE 10 MG/ML IJ SOLN
INTRAMUSCULAR | Status: DC | PRN
  Administered 2018-09-12: 10 mg via INTRAVENOUS

## 2018-09-12 MED ORDER — HYDROCODONE-ACETAMINOPHEN 5-325 MG PO TABS: 1.0000 | ORAL_TABLET | Freq: Four times a day (QID) | ORAL | 0 refills | Status: DC | PRN

## 2018-09-12 MED ORDER — FENTANYL CITRATE (PF) 100 MCG/2ML IJ SOLN
INTRAMUSCULAR | Status: DC | PRN
  Administered 2018-09-12: 150 ug via INTRAVENOUS

## 2018-09-12 MED ORDER — LIDOCAINE 2% (20 MG/ML) 5 ML SYRINGE
INTRAMUSCULAR | Status: DC | PRN
  Administered 2018-09-12: 100 mg via INTRAVENOUS

## 2018-09-12 MED ORDER — MIDAZOLAM HCL 2 MG/2ML IJ SOLN
INTRAMUSCULAR | Status: AC
  Filled 2018-09-12: qty 2

## 2018-09-12 MED ORDER — SODIUM CHLORIDE 0.9 % IV SOLN
INTRAVENOUS | Status: DC | PRN
  Administered 2018-09-12: 08:00:00
  Administered 2018-09-12: 5 mL

## 2018-09-12 MED ORDER — CELECOXIB 200 MG PO CAPS
200.0000 mg | ORAL_CAPSULE | ORAL | Status: AC
  Administered 2018-09-12: 200 mg via ORAL
  Filled 2018-09-12: qty 1

## 2018-09-12 MED ORDER — ROCURONIUM BROMIDE 10 MG/ML (PF) SYRINGE
PREFILLED_SYRINGE | INTRAVENOUS | Status: DC | PRN
  Administered 2018-09-12: 50 mg via INTRAVENOUS

## 2018-09-12 MED ORDER — CIPROFLOXACIN IN D5W 400 MG/200ML IV SOLN
400.0000 mg | INTRAVENOUS | Status: AC
  Administered 2018-09-12: 400 mg via INTRAVENOUS
  Filled 2018-09-12: qty 200

## 2018-09-12 MED ORDER — HYDROMORPHONE HCL 1 MG/ML IJ SOLN
INTRAMUSCULAR | Status: AC
  Filled 2018-09-12: qty 1

## 2018-09-12 MED ORDER — LACTATED RINGERS IV SOLN
INTRAVENOUS | Status: DC | PRN
  Administered 2018-09-12 (×2): via INTRAVENOUS

## 2018-09-12 MED ORDER — ACETAMINOPHEN 500 MG PO TABS
1000.0000 mg | ORAL_TABLET | ORAL | Status: AC
  Administered 2018-09-12: 1000 mg via ORAL
  Filled 2018-09-12: qty 2

## 2018-09-12 MED ORDER — EPHEDRINE 5 MG/ML INJ
INTRAVENOUS | Status: AC
  Filled 2018-09-12: qty 10

## 2018-09-12 MED ORDER — MIDAZOLAM HCL 5 MG/5ML IJ SOLN
INTRAMUSCULAR | Status: DC | PRN
  Administered 2018-09-12: 2 mg via INTRAVENOUS

## 2018-09-12 MED ORDER — LIDOCAINE 2% (20 MG/ML) 5 ML SYRINGE
INTRAMUSCULAR | Status: AC
  Filled 2018-09-12: qty 5

## 2018-09-12 MED ORDER — GABAPENTIN 300 MG PO CAPS
300.0000 mg | ORAL_CAPSULE | ORAL | Status: AC
  Administered 2018-09-12: 300 mg via ORAL
  Filled 2018-09-12: qty 1

## 2018-09-12 MED ORDER — FENTANYL CITRATE (PF) 250 MCG/5ML IJ SOLN
INTRAMUSCULAR | Status: AC
  Filled 2018-09-12: qty 5

## 2018-09-12 SURGICAL SUPPLY — 39 items
APPLIER CLIP 5 13 M/L LIGAMAX5 (MISCELLANEOUS) ×3
BLADE CLIPPER SURG (BLADE) IMPLANT
CANISTER SUCT 3000ML PPV (MISCELLANEOUS) ×3 IMPLANT
CATH REDDICK CHOLANGI 4FR 50CM (CATHETERS) ×3 IMPLANT
CHLORAPREP W/TINT 26ML (MISCELLANEOUS) ×3 IMPLANT
CLIP APPLIE 5 13 M/L LIGAMAX5 (MISCELLANEOUS) ×1 IMPLANT
COVER MAYO STAND STRL (DRAPES) ×3 IMPLANT
COVER SURGICAL LIGHT HANDLE (MISCELLANEOUS) ×3 IMPLANT
COVER WAND RF STERILE (DRAPES) ×3 IMPLANT
DERMABOND ADVANCED (GAUZE/BANDAGES/DRESSINGS) ×8
DERMABOND ADVANCED .7 DNX12 (GAUZE/BANDAGES/DRESSINGS) ×4 IMPLANT
DRAPE C-ARM 42X72 X-RAY (DRAPES) ×3 IMPLANT
ELECT REM PT RETURN 9FT ADLT (ELECTROSURGICAL) ×3
ELECTRODE REM PT RTRN 9FT ADLT (ELECTROSURGICAL) ×1 IMPLANT
GLOVE BIO SURGEON STRL SZ7 (GLOVE) ×6 IMPLANT
GLOVE BIO SURGEON STRL SZ7.5 (GLOVE) ×6 IMPLANT
GLOVE BIOGEL M 7.0 STRL (GLOVE) ×3 IMPLANT
GLOVE INDICATOR 7.0 STRL GRN (GLOVE) ×3 IMPLANT
GLOVE INDICATOR 7.5 STRL GRN (GLOVE) ×3 IMPLANT
GOWN STRL REUS W/ TWL LRG LVL3 (GOWN DISPOSABLE) ×3 IMPLANT
GOWN STRL REUS W/TWL LRG LVL3 (GOWN DISPOSABLE) ×6
IV CATH 14GX2 1/4 (CATHETERS) ×3 IMPLANT
KIT BASIN OR (CUSTOM PROCEDURE TRAY) ×3 IMPLANT
KIT TURNOVER KIT B (KITS) ×3 IMPLANT
NS IRRIG 1000ML POUR BTL (IV SOLUTION) ×3 IMPLANT
PAD ARMBOARD 7.5X6 YLW CONV (MISCELLANEOUS) ×3 IMPLANT
POUCH SPECIMEN RETRIEVAL 10MM (ENDOMECHANICALS) ×3 IMPLANT
SCISSORS LAP 5X35 DISP (ENDOMECHANICALS) ×3 IMPLANT
SET IRRIG TUBING LAPAROSCOPIC (IRRIGATION / IRRIGATOR) ×3 IMPLANT
SLEEVE ENDOPATH XCEL 5M (ENDOMECHANICALS) ×6 IMPLANT
SPECIMEN JAR SMALL (MISCELLANEOUS) ×3 IMPLANT
SUT MNCRL AB 4-0 PS2 18 (SUTURE) ×3 IMPLANT
TOWEL OR 17X24 6PK STRL BLUE (TOWEL DISPOSABLE) ×3 IMPLANT
TOWEL OR 17X26 10 PK STRL BLUE (TOWEL DISPOSABLE) ×3 IMPLANT
TRAY LAPAROSCOPIC MC (CUSTOM PROCEDURE TRAY) ×3 IMPLANT
TROCAR XCEL BLUNT TIP 100MML (ENDOMECHANICALS) ×3 IMPLANT
TROCAR XCEL NON-BLD 5MMX100MML (ENDOMECHANICALS) ×3 IMPLANT
TUBING INSUFFLATION (TUBING) ×3 IMPLANT
WATER STERILE IRR 1000ML POUR (IV SOLUTION) ×3 IMPLANT

## 2018-09-12 NOTE — Interval H&P Note (Signed)
History and Physical Interval Note:  09/12/2018 7:26 AM  Diana Goodman  has presented today for surgery, with the diagnosis of gallstones  The various methods of treatment have been discussed with the patient and family. After consideration of risks, benefits and other options for treatment, the patient has consented to  Procedure(s): LAPAROSCOPIC CHOLECYSTECTOMY WITH INTRAOPERATIVE CHOLANGIOGRAM ERAS PATHWAY (N/A) as a surgical intervention .  The patient's history has been reviewed, patient examined, no change in status, stable for surgery.  I have reviewed the patient's chart and labs.  Questions were answered to the patient's satisfaction.     Chevis PrettyPaul Toth III

## 2018-09-12 NOTE — Anesthesia Procedure Notes (Signed)
Procedure Name: Intubation Date/Time: 09/12/2018 7:45 AM Performed by: Lovie Cholock, Norita Meigs K, CRNA Pre-anesthesia Checklist: Patient identified, Emergency Drugs available, Suction available and Patient being monitored Patient Re-evaluated:Patient Re-evaluated prior to induction Oxygen Delivery Method: Circle System Utilized Preoxygenation: Pre-oxygenation with 100% oxygen Induction Type: IV induction Ventilation: Mask ventilation without difficulty Laryngoscope Size: Miller and 2 Grade View: Grade I Tube type: Oral Tube size: 7.0 mm Number of attempts: 1 Airway Equipment and Method: Stylet and Oral airway Placement Confirmation: ETT inserted through vocal cords under direct vision,  positive ETCO2 and breath sounds checked- equal and bilateral Secured at: 21 cm Tube secured with: Tape Dental Injury: Teeth and Oropharynx as per pre-operative assessment

## 2018-09-12 NOTE — Progress Notes (Signed)
POCT urine completed - result was negative.

## 2018-09-12 NOTE — Op Note (Signed)
09/12/2018  8:35 AM  PATIENT:  Diana Goodman  42 y.o. female  PRE-OPERATIVE DIAGNOSIS:  gallstones  POST-OPERATIVE DIAGNOSIS:  gallstones  PROCEDURE:  Procedure(s): LAPAROSCOPIC CHOLECYSTECTOMY WITH INTRAOPERATIVE CHOLANGIOGRAM ERAS PATHWAY (N/A)  SURGEON:  Surgeon(s) and Role:    * Griselda Miner, MD - Primary  PHYSICIAN ASSISTANT:   ASSISTANTS: Magnus Ivan, RNFA   ANESTHESIA:   local and general  EBL:  minimal   BLOOD ADMINISTERED:none  DRAINS: none   LOCAL MEDICATIONS USED:  MARCAINE     SPECIMEN:  Source of Specimen:  gallbladder  DISPOSITION OF SPECIMEN:  PATHOLOGY  COUNTS:  YES  TOURNIQUET:  * No tourniquets in log *  DICTATION: .Dragon Dictation     Procedure: After informed consent was obtained the patient was brought to the operating room and placed in the supine position on the operating room table. After adequate induction of general anesthesia the patient's abdomen was prepped with ChloraPrep allowed to dry and draped in usual sterile manner. An appropriate timeout was performed. The area below the umbilicus was infiltrated with quarter percent  Marcaine. A small incision was made with a 15 blade knife. The incision was carried down through the subcutaneous tissue bluntly with a hemostat and [employer] retractors. The linea alba was identified. The linea alba was incised with a 15 blade knife and each side was grasped with Coker clamps. The preperitoneal space was then probed with a hemostat until the peritoneum was opened and access was gained to the abdominal cavity. A 0 Vicryl pursestring stitch was placed in the fascia surrounding the opening. A Hassan cannula was then placed through the opening and anchored in place with the previously placed Vicryl purse string stitch. The abdomen was insufflated with carbon dioxide without difficulty. A laparoscope was inserted through the [facility] cannula in the right upper quadrant was inspected. Next the epigastric  region was infiltrated with % Marcaine. A small incision was made with a 15 blade knife. A 5 mm port was placed bluntly through this incision into the abdominal cavity under direct vision. Next 2 sites were chosen laterally on the right side of the abdomen for placement of 5 mm ports. Each of these areas was infiltrated with quarter percent Marcaine. Small stab incisions were made with a 15 blade knife. 5 mm ports were then placed bluntly through these incisions into the abdominal cavity under direct vision without difficulty. A blunt grasper was placed through the lateralmost 5 mm port and used to grasp the dome of the gallbladder and elevated anteriorly and superiorly. Another blunt grasper was placed through the other 5 mm port and used to retract the body and neck of the gallbladder. A dissector was placed through the epigastric port and using the electrocautery the peritoneal reflection at the gallbladder neck was opened. Blunt dissection was then carried out in this area until the gallbladder neck-cystic duct junction was readily identified and a good critical window was created. A single clip was placed on the gallbladder neck. A small  ductotomy was made just below the clip with laparoscopic scissors. A 14-gauge Angiocath was then placed through the anterior abdominal wall under direct vision. A Reddick cholangiogram catheter was then placed through the Angiocath and flushed. The catheter was then placed in the cystic duct and anchored in place with a clip. A cholangiogram was obtained that showed no filling defects good emptying into the duodenum an adequate length on the cystic duct. The anchoring clip and catheters were then removed from  the patient. 3 clips were placed proximally on the cystic duct and the duct was divided between the 2 sets of clips. Posterior to this the cystic artery was identified and again dissected bluntly in a circumferential manner until a good window  was created. 2 clips were  placed proximally and one distally on the artery and the artery was divided between the 2 sets of clips. Next a laparoscopic hook cautery device was used to separate the gallbladder from the liver bed. Prior to completely detaching the gallbladder from the liver bed the liver bed was inspected and several small bleeding points were coagulated with the electrocautery until the area was completely hemostatic. The gallbladder was then detached the rest of it from the liver bed without difficulty. A laparoscopic bag was inserted through the hassan port. The laparoscope was moved to the epigastric port. The gallbladder was placed within the bag and the bag was sealed.  The bag with the gallbladder was [facility] the infraumbilical port without difficulty. The fascial defect was then closed with the previously placed Vicryl pursestring stitch as well as with another figure-of-eight 0 Vicryl stitch. The liver bed was inspected again and found to be hemostatic. The abdomen was irrigated with copious amounts of saline until the effluent was clear. The ports were then removed under direct vision without difficulty and were found to be hemostatic. The gas was allowed to escape. The skin incisions were all closed with interrupted 4-0 Monocryl subcuticular stitches. Dermabond dressings were applied. The patient tolerated the procedure well. At the end of the case all needle sponge and instrument counts were correct. The patient was then awakened and taken to recovery in stable condition  PLAN OF CARE: Discharge to home after PACU  PATIENT DISPOSITION:  PACU - hemodynamically stable.   Delay start of Pharmacological VTE agent (>24hrs) due to surgical blood loss or risk of bleeding: not applicable

## 2018-09-12 NOTE — Anesthesia Postprocedure Evaluation (Signed)
Anesthesia Post Note  Patient: Diana Goodman  Procedure(s) Performed: LAPAROSCOPIC CHOLECYSTECTOMY WITH INTRAOPERATIVE CHOLANGIOGRAM ERAS PATHWAY (N/A )     Patient location during evaluation: PACU Anesthesia Type: General Level of consciousness: awake and alert Pain management: pain level controlled Vital Signs Assessment: post-procedure vital signs reviewed and stable Respiratory status: spontaneous breathing, nonlabored ventilation, respiratory function stable and patient connected to nasal cannula oxygen Cardiovascular status: blood pressure returned to baseline and stable Postop Assessment: no apparent nausea or vomiting Anesthetic complications: no    Last Vitals:  Vitals:   09/12/18 1009 09/12/18 1045  BP: 106/74 (!) 90/59  Pulse: 74 84  Resp:    Temp:    SpO2: 100% 100%    Last Pain:  Vitals:   09/12/18 1009  TempSrc:   PainSc: 3                  Vesper Trant DAVID

## 2018-09-12 NOTE — Transfer of Care (Signed)
Immediate Anesthesia Transfer of Care Note  Patient: Diana Goodman  Procedure(s) Performed: LAPAROSCOPIC CHOLECYSTECTOMY WITH INTRAOPERATIVE CHOLANGIOGRAM ERAS PATHWAY (N/A )  Patient Location: PACU  Anesthesia Type:General  Level of Consciousness: awake, oriented and patient cooperative  Airway & Oxygen Therapy: Patient Spontanous Breathing and Patient connected to nasal cannula oxygen  Post-op Assessment: Report given to RN and Post -op Vital signs reviewed and stable  Post vital signs: Reviewed  Last Vitals:  Vitals Value Taken Time  BP 110/70 09/12/2018  8:58 AM  Temp    Pulse 93 09/12/2018  9:02 AM  Resp 13 09/12/2018  9:02 AM  SpO2 100 % 09/12/2018  9:02 AM  Vitals shown include unvalidated device data.  Last Pain:  Vitals:   09/12/18 0618  TempSrc: Oral  PainSc:       Patients Stated Pain Goal: 3 (09/12/18 0610)  Complications: No apparent anesthesia complications

## 2018-09-12 NOTE — Anesthesia Preprocedure Evaluation (Addendum)
Anesthesia Evaluation  Patient identified by MRN, date of birth, ID band Patient awake    Reviewed: Allergy & Precautions, NPO status , Patient's Chart, lab work & pertinent test results  Airway Mallampati: I  TM Distance: >3 FB Neck ROM: Full    Dental  (+) Teeth Intact, Dental Advisory Given   Pulmonary neg pulmonary ROS,    Pulmonary exam normal        Cardiovascular Normal cardiovascular exam Rhythm:Regular Rate:Normal     Neuro/Psych  Headaches, PSYCHIATRIC DISORDERS Anxiety Depression    GI/Hepatic Neg liver ROS, GERD  Medicated and Controlled,  Endo/Other  negative endocrine ROS  Renal/GU negative Renal ROS     Musculoskeletal   Abdominal   Peds  Hematology negative hematology ROS (+)   Anesthesia Other Findings   Reproductive/Obstetrics negative OB ROS                            Anesthesia Physical Anesthesia Plan  ASA: II  Anesthesia Plan: General   Post-op Pain Management:    Induction: Intravenous  PONV Risk Score and Plan: 3 and Ondansetron, Midazolam and Dexamethasone  Airway Management Planned: Oral ETT  Additional Equipment:   Intra-op Plan:   Post-operative Plan: Extubation in OR  Informed Consent:   Plan Discussed with: CRNA and Surgeon  Anesthesia Plan Comments:        Anesthesia Quick Evaluation

## 2018-09-12 NOTE — H&P (Signed)
Diana Goodman  Location: [facility] Surgery Patient #: [id number] DOB: [birth year] Widowed / Language: [place] / Race: White Female   History of Present Illness  The patient is a 42 year old female who presents with abdominal pain. We are asked to see the patient in consultation by Dr. Zollie Pee to evaluate her for gallstones. The patient is a 42 year old white female who has been experiencing reflux-type heartburn pain for the last several months. The pain seems to radiate into her back. She has had some nausea but she associates this more with her headaches. She does have a history of peptic ulcer disease   Diagnostic Studies History  Colonoscopy  never Mammogram  within last year Pap Smear  1-5 years ago  Allergies  No Known Drug Allergies  Allergies Reconciled   Medication History  Vitamin D (Ergocalciferol) (50000UNIT Capsule, Oral) Active. Escitalopram Oxalate (20MG  Tablet, Oral) Active. Pantoprazole Sodium (40MG  Tablet DR, Oral) Active. Ondansetron HCl (4MG  Tablet, Oral) Active. tiZANidine HCl (2MG  Tablet, Oral) Active. Promethazine HCl (50MG  Tablet, Oral) Active. Rizatriptan Benzoate (10MG  Tablet Disint, Oral) Active. Sucralfate (1GM Tablet, Oral) Active. Topiramate (25MG  Tablet, Oral) Active. clonazePAM (1MG  Tablet, Oral) Active. Butalbital-APAP-Caffeine (50-300-40MG  Capsule, Oral) Active. Multi Vitamin (Oral) Active. Medications Reconciled  Social History  Alcohol use  Remotely quit alcohol use. Caffeine use  Carbonated beverages. No drug use  Tobacco use  Never smoker.  Family History  Alcohol Abuse  Brother, Family Members In [place], Father. Breast Cancer  Family Members In General. Cancer  Family Members In General, Father. Depression  Brother, Family Members In [place], Father, Son. Migraine Headache  Mother.  Pregnancy / Birth History  Age at menarche  13 years. Contraceptive History  Intrauterine device. Gravida   2 Maternal age  47-35 Para  2  Other Problems  Alcohol Abuse  Asthma  Cholelithiasis  Depression  Gastric Ulcer  Gastroesophageal Reflux Disease  Kidney Stone     Review of Systems  General Present- Fatigue. Not Present- Appetite Loss, Chills, Fever, Night Sweats, Weight Gain and Weight Loss. Skin Not Present- Change in Wart/Mole, Dryness, Hives, Jaundice, New Lesions, Non-Healing Wounds, Rash and Ulcer. HEENT Not Present- Earache, Hearing Loss, Hoarseness, Nose Bleed, Oral Ulcers, Ringing in the Ears, Seasonal Allergies, Sinus Pain, Sore Throat, Visual Disturbances, Wears glasses/contact lenses and Yellow Eyes. Respiratory Not Present- Bloody sputum, Chronic Cough, Difficulty Breathing, Snoring and Wheezing. Breast Not Present- Breast Mass, Breast Pain, Nipple Discharge and Skin Changes. Cardiovascular Not Present- Chest Pain, Difficulty Breathing Lying Down, Leg Cramps, Palpitations, Rapid Heart Rate, Shortness of Breath and Swelling of Extremities. Gastrointestinal Present- Abdominal Pain, Constipation and Nausea. Not Present- Bloating, Bloody Stool, Change in Bowel Habits, Chronic diarrhea, Difficulty Swallowing, Excessive gas, Gets full quickly at meals, Hemorrhoids, Indigestion, Rectal Pain and Vomiting. Female Genitourinary Present- Frequency. Not Present- Nocturia, Painful Urination, Pelvic Pain and Urgency. Musculoskeletal Not Present- Back Pain, Joint Pain, Joint Stiffness, Muscle Pain, Muscle Weakness and Swelling of Extremities. Neurological Present- Headaches and Weakness. Not Present- Decreased Memory, Fainting, Numbness, Seizures, Tingling, Tremor and Trouble walking. Psychiatric Present- Anxiety and Depression. Not Present- Bipolar, Change in Sleep Pattern, Fearful and Frequent crying. Endocrine Present- Heat Intolerance. Not Present- Cold Intolerance, Excessive Hunger, Hair Changes, Hot flashes and New Diabetes. Hematology Present- Easy Bruising. Not Present-  Blood Thinners, Excessive bleeding, Gland problems, HIV and Persistent Infections.  Vitals  Weight: 132 lb Height: 64in Body Surface Area: 1.64 m Body Mass Index: 22.66 kg/m  Temp.: 97.61F(Temporal)  Pulse: 74 (Regular)  P.OX: 99% (Room air) BP: 100/78 (Sitting, Left Arm, Standard)       Physical Exam  General Mental Status-Alert. General Appearance-Consistent with stated age. Hydration-Well hydrated. Voice-Normal.  Head and Neck Head-normocephalic, atraumatic with no lesions or palpable masses. Trachea-midline. Thyroid Gland Characteristics - normal size and consistency.  Eye Eyeball - Bilateral-Extraocular movements intact. Sclera/Conjunctiva - Bilateral-No scleral icterus.  Chest and Lung Exam Chest and lung exam reveals -quiet, even and easy respiratory effort with no use of accessory muscles and on auscultation, normal breath sounds, no adventitious sounds and normal vocal resonance. Inspection Chest Wall - Normal. Back - normal.  Cardiovascular Cardiovascular examination reveals -normal heart sounds, regular rate and rhythm with no murmurs and normal pedal pulses bilaterally.  Abdomen Inspection Inspection of the abdomen reveals - No Hernias. Skin - Scar - no surgical scars. Palpation/Percussion Palpation and Percussion of the abdomen reveal - Soft, Non Tender, No Rebound tenderness, No Rigidity (guarding) and No hepatosplenomegaly. Auscultation Auscultation of the abdomen reveals - Bowel sounds normal.  Neurologic Neurologic evaluation reveals -alert and oriented x 3 with no impairment of recent or remote memory. Mental Status-Normal.  Musculoskeletal Normal Exam - Left-Upper Extremity Strength Normal and Lower Extremity Strength Normal. Normal Exam - Right-Upper Extremity Strength Normal and Lower Extremity Strength Normal.  Lymphatic Head & Neck  General Head & Neck Lymphatics: Bilateral - Description -  Normal. Axillary  General Axillary Region: Bilateral - Description - Normal. Tenderness - Non Tender. Femoral & Inguinal  Generalized Femoral & Inguinal Lymphatics: Bilateral - Description - Normal. Tenderness - Non Tender.    Assessment & Plan  GALLSTONES (K80.20) Impression: The patient appears to have symptomatic gallstones. Because of the risk of further painful episodes and possible pancreatitis or think she would benefit from having the gallbladder removed. She would also like to have this done. I have discussed with her in detail the risks and benefits of the operation as well as some of the technical aspects including the risk of common duct injury and she understands and wishes to proceed. She also has significant headaches and I do not think that removing her gallbladder will help to headaches.

## 2018-09-13 ENCOUNTER — Encounter (HOSPITAL_COMMUNITY): Payer: Self-pay | Admitting: General Surgery

## 2018-09-25 ENCOUNTER — Inpatient Hospital Stay (HOSPITAL_COMMUNITY)
Admission: EM | Admit: 2018-09-25 | Discharge: 2018-09-27 | DRG: 917 | Disposition: A | Discharge status: Home / Self Care | Payer: Medicaid Other | Attending: Internal Medicine | Admitting: Internal Medicine

## 2018-09-25 ENCOUNTER — Encounter (HOSPITAL_COMMUNITY): Payer: Self-pay

## 2018-09-25 ENCOUNTER — Other Ambulatory Visit: Payer: Self-pay

## 2018-09-25 DIAGNOSIS — T50901A Poisoning by unspecified drugs, medicaments and biological substances, accidental (unintentional), initial encounter: Secondary | ICD-10-CM | POA: Diagnosis not present

## 2018-09-25 DIAGNOSIS — F419 Anxiety disorder, unspecified: Secondary | ICD-10-CM | POA: Diagnosis present

## 2018-09-25 DIAGNOSIS — F429 Obsessive-compulsive disorder, unspecified: Secondary | ICD-10-CM | POA: Diagnosis present

## 2018-09-25 DIAGNOSIS — G4700 Insomnia, unspecified: Secondary | ICD-10-CM | POA: Diagnosis present

## 2018-09-25 DIAGNOSIS — Z9049 Acquired absence of other specified parts of digestive tract: Secondary | ICD-10-CM

## 2018-09-25 DIAGNOSIS — E559 Vitamin D deficiency, unspecified: Secondary | ICD-10-CM | POA: Diagnosis present

## 2018-09-25 DIAGNOSIS — T391X1A Poisoning by 4-Aminophenol derivatives, accidental (unintentional), initial encounter: Principal | ICD-10-CM | POA: Diagnosis present

## 2018-09-25 DIAGNOSIS — G92 Toxic encephalopathy: Secondary | ICD-10-CM | POA: Diagnosis present

## 2018-09-25 DIAGNOSIS — T50904A Poisoning by unspecified drugs, medicaments and biological substances, undetermined, initial encounter: Secondary | ICD-10-CM

## 2018-09-25 DIAGNOSIS — Z880 Allergy status to penicillin: Secondary | ICD-10-CM

## 2018-09-25 DIAGNOSIS — F909 Attention-deficit hyperactivity disorder, unspecified type: Secondary | ICD-10-CM | POA: Diagnosis present

## 2018-09-25 DIAGNOSIS — R45851 Suicidal ideations: Secondary | ICD-10-CM | POA: Diagnosis not present

## 2018-09-25 DIAGNOSIS — Z975 Presence of (intrauterine) contraceptive device: Secondary | ICD-10-CM | POA: Diagnosis not present

## 2018-09-25 DIAGNOSIS — T391X2A Poisoning by 4-Aminophenol derivatives, intentional self-harm, initial encounter: Secondary | ICD-10-CM | POA: Diagnosis not present

## 2018-09-25 DIAGNOSIS — G43909 Migraine, unspecified, not intractable, without status migrainosus: Secondary | ICD-10-CM | POA: Diagnosis present

## 2018-09-25 DIAGNOSIS — F418 Other specified anxiety disorders: Secondary | ICD-10-CM | POA: Diagnosis present

## 2018-09-25 DIAGNOSIS — Z818 Family history of other mental and behavioral disorders: Secondary | ICD-10-CM

## 2018-09-25 DIAGNOSIS — T424X2A Poisoning by benzodiazepines, intentional self-harm, initial encounter: Secondary | ICD-10-CM | POA: Diagnosis not present

## 2018-09-25 DIAGNOSIS — T1491XA Suicide attempt, initial encounter: Secondary | ICD-10-CM | POA: Diagnosis not present

## 2018-09-25 LAB — COMPREHENSIVE METABOLIC PANEL
ALT: 16 U/L (ref 0–44)
ANION GAP: 9 (ref 5–15)
AST: 16 U/L (ref 15–41)
Albumin: 4.1 g/dL (ref 3.5–5.0)
Alkaline Phosphatase: 41 U/L (ref 38–126)
BUN: 10 mg/dL (ref 6–20)
CO2: 18 mmol/L — ABNORMAL LOW (ref 22–32)
Calcium: 8.9 mg/dL (ref 8.9–10.3)
Chloride: 113 mmol/L — ABNORMAL HIGH (ref 98–111)
Creatinine, Ser: 0.87 mg/dL (ref 0.44–1.00)
GFR calc Af Amer: >60 mL/min (ref >60)
GFR calc non Af Amer: >60 mL/min (ref >60)
Glucose, Bld: 98 mg/dL (ref 70–99)
Potassium: 3.2 mmol/L — ABNORMAL LOW (ref 3.5–5.1)
Sodium: 140 mmol/L (ref 135–145)
TOTAL PROTEIN: 6.8 g/dL (ref 6.5–8.1)
Total Bilirubin: 0.4 mg/dL (ref 0.3–1.2)

## 2018-09-25 LAB — CBC
HCT: 39.6 % (ref 36.0–46.0)
Hemoglobin: 13.7 g/dL (ref 12.0–15.0)
MCH: 32.4 pg (ref 26.0–34.0)
MCHC: 34.6 g/dL (ref 30.0–36.0)
MCV: 93.6 fL (ref 80.0–100.0)
PLATELETS: 244 10*3/uL (ref 150–400)
RBC: 4.23 MIL/uL (ref 3.87–5.11)
RDW: 12.2 % (ref 11.5–15.5)
WBC: 4.7 10*3/uL (ref 4.0–10.5)
nRBC: 0 % (ref 0.0–0.2)

## 2018-09-25 LAB — CBG MONITORING, ED: Glucose-Capillary: 85 mg/dL (ref 70–99)

## 2018-09-25 LAB — ACETAMINOPHEN LEVEL: Acetaminophen (Tylenol), Serum: 54 ug/mL — ABNORMAL HIGH (ref 10–30)

## 2018-09-25 LAB — SALICYLATE LEVEL: Salicylate Lvl: <7 mg/dL (ref 2.8–30.0)

## 2018-09-25 LAB — ETHANOL: Alcohol, Ethyl (B): <10 mg/dL (ref <10)

## 2018-09-25 MED ORDER — ACETYLCYSTEINE LOAD VIA INFUSION: 150.0000 mg/kg | Freq: Once | INTRAVENOUS | Status: DC

## 2018-09-25 MED ORDER — SODIUM CHLORIDE 0.9 % IV BOLUS (SEPSIS)
1000.0000 mL | Freq: Once | INTRAVENOUS | Status: AC
  Administered 2018-09-25: 1000 mL via INTRAVENOUS

## 2018-09-25 MED ORDER — SODIUM CHLORIDE 0.9 % IV BOLUS (SEPSIS)
1000.0000 mL | Freq: Once | INTRAVENOUS | Status: AC
  Administered 2018-09-26: 1000 mL via INTRAVENOUS

## 2018-09-25 MED ORDER — ACETYLCYSTEINE LOAD VIA INFUSION
150.0000 mg/kg | Freq: Once | INTRAVENOUS | Status: AC
  Administered 2018-09-26: 8760 mg via INTRAVENOUS
  Filled 2018-09-25: qty 219

## 2018-09-25 MED ORDER — DEXTROSE 5 % IV SOLN
15.0000 mg/kg/h | INTRAVENOUS | Status: DC
  Administered 2018-09-26: 15 mg/kg/h via INTRAVENOUS
  Filled 2018-09-25 (×2): qty 150

## 2018-09-25 NOTE — ED Triage Notes (Signed)
Patient arrived by [id number] was called by a nurse friend after patient told her she took possibly over 20 Fioricet since mid day. Took unknown amount of Klonopin. EMS states on their arrival-patient agitated-lethargic "stumbling around". SBP 90's. Patient has 2 minor children at home that [employer] is with. Patient agitated in truck on way in and Haldol 5 mg IV at 2135. Patient is currently on stretcher and quiet. Patient told EMS she was trying to "get rid of a migraine".

## 2018-09-25 NOTE — ED Notes (Signed)
Pt placed on purwick. Waiting for urine specimen.

## 2018-09-25 NOTE — ED Notes (Signed)
Unable to obtain cath urine specimen

## 2018-09-25 NOTE — ED Triage Notes (Signed)
Patient had a recent chole on 12/13 and EMS reports patient's husband recently died

## 2018-09-25 NOTE — ED Notes (Signed)
Bed: WA17 Expected date:  Expected time:  Means of arrival:  Comments: 

## 2018-09-25 NOTE — ED Notes (Signed)
Date and time results received: 09/25/18 2315 Test:Acetaminophen Critical Value: 54  Name of Provider Notified: Sharilyn SitesLisa Sanders EDPA  Orders Received? Or Actions Taken?: see orders

## 2018-09-25 NOTE — ED Provider Notes (Signed)
[facility]-EMERGENCY DEPT Provider Note   CSN: [id number] Arrival date & time: 09/25/18  2147     History   Chief Complaint Chief Complaint  Patient presents with  . Overdose multiple medications    HPI Diana Goodman is a 42 y.o. female.  The history is provided by the EMS personnel and medical records.     Level 5 caveat: Overdose  42 year old female with history of ADHD, anxiety, depression, OCD, vitamin D deficiency, presenting to the ED with overdose.  Reportedly over the course of the day patient has taken over 20 Fioricet tablets an unknown amount of Klonopin.  She apparently called her friend who was a nurse who then called 911.  On EMS arrival patient was lethargic and stumbling around her house with mildly low blood pressures.  Patient has 2 small children that were essentially home alone given her current state, they are in [employer] custody at the moment.  Patient reportedly told EMS that she was "trying to get rid of a migraine headache".  Apparently her husband recently died, unknown other details about this.  Patient was agitated in route to the ED and was given Haldol.  She is not able to answer any questions on my exam.  Past Medical History:  Diagnosis Date  . ADHD   . Anxiety   . Depression   . Headache   . OCD (obsessive compulsive disorder)   . Vitamin D deficiency     Patient Active Problem List   Diagnosis Date Noted  . Intractable episodic cluster headache 12/04/2017  . Intractable chronic migraine without aura and without status migrainosus 12/04/2017  . Severe episode of recurrent major depressive disorder, without psychotic features (HCC) 12/04/2017  . Non compliance with medical treatment 12/04/2017    Past Surgical History:  Procedure Laterality Date  . CHOLECYSTECTOMY N/A 09/12/2018   Procedure: LAPAROSCOPIC CHOLECYSTECTOMY WITH INTRAOPERATIVE CHOLANGIOGRAM ERAS PATHWAY;  Surgeon: Griselda Miner, MD;  Location: [facility] OR;   Service: General;  Laterality: N/A;     OB History   No obstetric history on file.      Home Medications    Prior to Admission medications   Medication Sig Start Date End Date Taking? Authorizing Provider  acetaminophen (TYLENOL) 500 MG tablet Take 1,000 mg by mouth daily as needed (stomach pain).    [provider]  Acetaminophen-Caffeine (TENSION HEADACHE RELIEF) 500-65 MG TABS Take 2 tablets by mouth daily as needed (tension headaches).    [provider]  b complex vitamins capsule Take 1 capsule by mouth daily.    [provider]  cetirizine (ZYRTEC) 10 MG tablet Take 10 mg by mouth daily as needed for allergies.    [provider]  clonazePAM (KLONOPIN) 1 MG tablet Take 1 mg by mouth at bedtime.     [provider]  doxylamine, Sleep, (UNISOM) 25 MG tablet Take 50 mg by mouth at bedtime as needed. Pt stated that she takes two 50 mg tabs to total 100mg     [provider]  ergocalciferol (VITAMIN D2) 50000 units capsule Take 50,000 Units by mouth every Friday.     [provider]  escitalopram (LEXAPRO) 20 MG tablet Take 20 mg by mouth daily. 05/05/18   [provider]  HYDROcodone-acetaminophen (NORCO/VICODIN) 5-325 MG tablet Take 1-2 tablets by mouth every 6 (six) hours as needed for moderate pain or severe pain. 09/12/18   Griselda Miner, MD  levonorgestrel (MIRENA) 20 MCG/24HR IUD 1 each  by Intrauterine route continuous.    [provider]  Multiple Vitamin (MULTIVITAMIN WITH MINERALS) TABS tablet Take 1 tablet by mouth every evening.     [provider]  ondansetron (ZOFRAN) 4 MG tablet Take 12 mg by mouth 2 (two) times daily as needed for nausea or vomiting.    [provider]  pantoprazole (PROTONIX) 40 MG tablet Take 40 mg by mouth daily.    [provider]  risperiDONE (RISPERDAL) 3 MG tablet Take 1 tablet (3 mg total) by mouth at bedtime. Patient not taking: Reported on  08/25/2018 08/05/18   Melony OverlyHurst, Teresa T, PA-C  rizatriptan (MAXALT-MLT) 10 MG disintegrating tablet Take 1 tablet (10 mg total) by mouth as needed for migraine. May repeat in 2 hours if needed Patient not taking: Reported on 08/25/2018 04/09/18   Drema DallasJaffe, Adam R, DO  sucralfate (CARAFATE) 1 g tablet Take 1 g by mouth 3 (three) times daily as needed for heartburn. 06/12/18   [provider]  SUMAtriptan (IMITREX) 100 MG tablet May repeat once  in 2 hours if headache persists or recurs. Do not exceed 2 doses over 24 hours. Patient not taking: Reported on 02/21/2018 12/04/17   Dohmeier, Porfirio Mylararmen, MD  tiZANidine (ZANAFLEX) 2 MG tablet TAKE 1 TABLET BY MOUTH AT BEDTIME FOR 7 DAYS, 1 TABLET 2 TIMES A DAY FOR 7 DAYS THEN 1 TABLET 3 TIMES A DAY Patient taking differently: Take 2 mg by mouth daily as needed (neck / back pain).  08/04/18   Drema DallasJaffe, Adam R, DO  topiramate (TOPAMAX) 25 MG tablet Take 50 mg by mouth 2 (two) times daily.     [provider]  topiramate (TOPAMAX) 25 MG tablet TAKE 2 TABLETS BY MOUTH TWICE DAILY Patient taking differently: Take 50 mg by mouth 2 (two) times daily.  08/04/18   Drema DallasJaffe, Adam R, DO    Family History Family History  Problem Relation Age of Onset  . Depression Mother   . Anxiety disorder Mother   . Migraines Mother   . Depression Father   . Anxiety disorder Father   . Depression Brother   . Anxiety disorder Brother     Social History Social History   Tobacco Use  . Smoking status: Never Smoker  . Smokeless tobacco: Never Used  Substance Use Topics  . Alcohol use: No    Frequency: Never  . Drug use: No     Allergies   Penicillins   Review of Systems Review of Systems  Unable to perform ROS: Other     Physical Exam Updated Vital Signs BP 97/66 (BP Location: Right Arm)   Pulse 75   Temp 98.3 F (36.8 C) (Oral)   Resp 18   LMP  (LMP Unknown)   SpO2 99%   Physical Exam Vitals signs and nursing note reviewed.  Constitutional:       Appearance: She is well-developed.     Comments: Somnolent, drowsy, occasionally making mumbling noises  HENT:     Head: Normocephalic and atraumatic.  Eyes:     General: Lids are normal. No scleral icterus.    Conjunctiva/sclera: Conjunctivae normal.     Pupils: Pupils are equal, round, and reactive to light.  Neck:     Musculoskeletal: Normal range of motion.  Cardiovascular:     Rate and Rhythm: Normal rate and regular rhythm.     Heart sounds: Normal heart sounds.  Pulmonary:     Effort: Pulmonary effort is normal.     Breath  sounds: Normal breath sounds. No stridor. No decreased breath sounds, wheezing or rhonchi.  Abdominal:     General: Bowel sounds are normal.     Palpations: Abdomen is soft.  Musculoskeletal: Normal range of motion.     Comments: atraumatic  Skin:    General: Skin is warm and dry.  Neurological:     Mental Status: She is oriented to person, place, and time.      ED Treatments / Results  Labs (all labs ordered are listed, but only abnormal results are displayed) Labs Reviewed  COMPREHENSIVE METABOLIC PANEL - Abnormal; Notable for the following components:      Result Value   Potassium 3.2 (*)    Chloride 113 (*)    CO2 18 (*)    All other components within normal limits  ACETAMINOPHEN LEVEL - Abnormal; Notable for the following components:   Acetaminophen (Tylenol), Serum 54 (*)    All other components within normal limits  ETHANOL  SALICYLATE LEVEL  CBC  RAPID URINE DRUG SCREEN, HOSP PERFORMED  PREGNANCY, URINE  CBG MONITORING, ED    EKG None  Radiology No results found.  Procedures Procedures (including critical care time)  CRITICAL CARE Performed by: Garlon HatchetLisa M Harriet Bollen   Total critical care time: 45 minutes  Critical care time was exclusive of separately billable procedures and treating other patients.  Critical care was necessary to treat or prevent imminent or life-threatening deterioration.  Critical care was time spent  personally by me on the following activities: development of treatment plan with patient and/or surrogate as well as nursing, discussions with consultants, evaluation of patient's response to treatment, examination of patient, obtaining history from patient or surrogate, ordering and performing treatments and interventions, ordering and review of laboratory studies, ordering and review of radiographic studies, pulse oximetry and re-evaluation of patient's condition.   Medications Ordered in ED Medications  acetylcysteine (ACETADOTE) 40 mg/mL load via infusion 8,760 mg (8,760 mg Intravenous Bolus from Bag 09/26/18 0055)    Followed by  acetylcysteine (ACETADOTE) 30,000 mg in dextrose 5 % 750 mL (40 mg/mL) infusion (15 mg/kg/hr  58.4 kg Intravenous New Bag/Given 09/26/18 0155)  sodium chloride 0.9 % bolus 1,000 mL (0 mLs Intravenous Stopped 09/26/18 0216)    Followed by  sodium chloride 0.9 % bolus 1,000 mL (0 mLs Intravenous Stopped 09/25/18 2359)     Initial Impression / Assessment and Plan / ED Course  I have reviewed the triage vital signs and the nursing notes.  Pertinent labs & imaging results that were available during my care of the patient were reviewed by me and considered in my medical decision making (see chart for details).  42 year old female here with overdose.  She began taking Fioricet earlier this morning and over the course of day has taken approximately 20 tablets of this as well as unknown amount of Klonopin.  Upon EMS arrival to the house she was found to be lethargic and stumbling around.  Her 2 small children are currently in [employer] custody as they were essentially left unsupervised.  Patient was combative on the way here and was given Haldol.  On arrival to ED she is not able to give me any additional history.  Her vitals are stable.  Labs pending.  Poison control contacted  11:22 PM Patient's tylenol level 54, otherwise reassuring.  Since it has been over 12 hours since  she started taking medications (began at 7:30AM per friend), poison control recommends acetadote and admission.  Discussed with Dr.  Toniann Fail-- will admit to SDU for ongoing care.  Final Clinical Impressions(s) / ED Diagnoses   Final diagnoses:  Drug overdose, undetermined intent, initial encounter    ED Discharge Orders    None       Garlon Hatchet, PA-C 09/26/18 0242    Jacalyn Lefevre, MD 09/26/18 [phone]

## 2018-09-26 ENCOUNTER — Other Ambulatory Visit: Payer: Self-pay

## 2018-09-26 ENCOUNTER — Encounter (HOSPITAL_COMMUNITY): Payer: Self-pay | Admitting: Internal Medicine

## 2018-09-26 DIAGNOSIS — T1491XA Suicide attempt, initial encounter: Secondary | ICD-10-CM

## 2018-09-26 DIAGNOSIS — T391X2A Poisoning by 4-Aminophenol derivatives, intentional self-harm, initial encounter: Secondary | ICD-10-CM

## 2018-09-26 DIAGNOSIS — T424X2A Poisoning by benzodiazepines, intentional self-harm, initial encounter: Secondary | ICD-10-CM

## 2018-09-26 DIAGNOSIS — T50901A Poisoning by unspecified drugs, medicaments and biological substances, accidental (unintentional), initial encounter: Secondary | ICD-10-CM

## 2018-09-26 DIAGNOSIS — R45851 Suicidal ideations: Secondary | ICD-10-CM

## 2018-09-26 LAB — CBC WITH DIFFERENTIAL/PLATELET
Abs Immature Granulocytes: 0.01 10*3/uL (ref 0.00–0.07)
Basophils Absolute: 0 10*3/uL (ref 0.0–0.1)
Basophils Relative: 0 %
EOS ABS: 0 10*3/uL (ref 0.0–0.5)
Eosinophils Relative: 1 %
HCT: 39.2 % (ref 36.0–46.0)
Hemoglobin: 13.2 g/dL (ref 12.0–15.0)
Immature Granulocytes: 0 %
LYMPHS ABS: 1.8 10*3/uL (ref 0.7–4.0)
Lymphocytes Relative: 33 %
MCH: 31.4 pg (ref 26.0–34.0)
MCHC: 33.7 g/dL (ref 30.0–36.0)
MCV: 93.1 fL (ref 80.0–100.0)
Monocytes Absolute: 0.5 10*3/uL (ref 0.1–1.0)
Monocytes Relative: 9 %
NRBC: 0 % (ref 0.0–0.2)
Neutro Abs: 3.2 10*3/uL (ref 1.7–7.7)
Neutrophils Relative %: 57 %
Platelets: 247 10*3/uL (ref 150–400)
RBC: 4.21 MIL/uL (ref 3.87–5.11)
RDW: 12.2 % (ref 11.5–15.5)
WBC: 5.5 10*3/uL (ref 4.0–10.5)

## 2018-09-26 LAB — GLUCOSE, CAPILLARY
Glucose-Capillary: 82 mg/dL (ref 70–99)
Glucose-Capillary: 77 mg/dL (ref 70–99)
Glucose-Capillary: 216 mg/dL — ABNORMAL HIGH (ref 70–99)
Glucose-Capillary: 99 mg/dL (ref 70–99)

## 2018-09-26 LAB — BASIC METABOLIC PANEL
Anion gap: 9 (ref 5–15)
BUN: 7 mg/dL (ref 6–20)
CO2: 16 mmol/L — ABNORMAL LOW (ref 22–32)
Calcium: 7.9 mg/dL — ABNORMAL LOW (ref 8.9–10.3)
Chloride: 116 mmol/L — ABNORMAL HIGH (ref 98–111)
Creatinine, Ser: 0.76 mg/dL (ref 0.44–1.00)
GFR calc Af Amer: >60 mL/min (ref >60)
GFR calc non Af Amer: >60 mL/min (ref >60)
Glucose, Bld: 105 mg/dL — ABNORMAL HIGH (ref 70–99)
Potassium: 3.6 mmol/L (ref 3.5–5.1)
SODIUM: 141 mmol/L (ref 135–145)

## 2018-09-26 LAB — COMPREHENSIVE METABOLIC PANEL
ALT: 16 U/L (ref 0–44)
AST: 15 U/L (ref 15–41)
Albumin: 3.6 g/dL (ref 3.5–5.0)
Alkaline Phosphatase: 40 U/L (ref 38–126)
Anion gap: 9 (ref 5–15)
BUN: 5 mg/dL — AB (ref 6–20)
CO2: 16 mmol/L — ABNORMAL LOW (ref 22–32)
Calcium: 8.4 mg/dL — ABNORMAL LOW (ref 8.9–10.3)
Chloride: 116 mmol/L — ABNORMAL HIGH (ref 98–111)
Creatinine, Ser: 0.72 mg/dL (ref 0.44–1.00)
GFR calc Af Amer: >60 mL/min (ref >60)
GFR calc non Af Amer: >60 mL/min (ref >60)
Glucose, Bld: 144 mg/dL — ABNORMAL HIGH (ref 70–99)
POTASSIUM: 3.5 mmol/L (ref 3.5–5.1)
Sodium: 141 mmol/L (ref 135–145)
Total Bilirubin: 0.5 mg/dL (ref 0.3–1.2)
Total Protein: 6.3 g/dL — ABNORMAL LOW (ref 6.5–8.1)

## 2018-09-26 LAB — RAPID URINE DRUG SCREEN, HOSP PERFORMED
AMPHETAMINES: NOT DETECTED
BENZODIAZEPINES: NOT DETECTED
Barbiturates: POSITIVE — AB
COCAINE: NOT DETECTED
Opiates: NOT DETECTED
Tetrahydrocannabinol: NOT DETECTED

## 2018-09-26 LAB — MRSA PCR SCREENING: MRSA by PCR: NEGATIVE

## 2018-09-26 LAB — HEPATIC FUNCTION PANEL
ALT: 14 U/L (ref 0–44)
AST: 13 U/L — ABNORMAL LOW (ref 15–41)
Albumin: 3.6 g/dL (ref 3.5–5.0)
Alkaline Phosphatase: 37 U/L — ABNORMAL LOW (ref 38–126)
Bilirubin, Direct: <0.1 mg/dL (ref 0.0–0.2)
Total Bilirubin: 0.4 mg/dL (ref 0.3–1.2)
Total Protein: 6.2 g/dL — ABNORMAL LOW (ref 6.5–8.1)

## 2018-09-26 LAB — PREGNANCY, URINE: Preg Test, Ur: NEGATIVE

## 2018-09-26 LAB — CBG MONITORING, ED: Glucose-Capillary: 97 mg/dL (ref 70–99)

## 2018-09-26 LAB — PROTIME-INR
INR: 1.19
Prothrombin Time: 15 seconds (ref 11.4–15.2)

## 2018-09-26 LAB — ACETAMINOPHEN LEVEL: Acetaminophen (Tylenol), Serum: <10 ug/mL — ABNORMAL LOW (ref 10–30)

## 2018-09-26 LAB — HIV ANTIBODY (ROUTINE TESTING W REFLEX): HIV Screen 4th Generation wRfx: NONREACTIVE

## 2018-09-26 LAB — AMMONIA: AMMONIA: 11 umol/L (ref 9–35)

## 2018-09-26 MED ORDER — CLONAZEPAM 1 MG PO TABS
1.0000 mg | ORAL_TABLET | Freq: Every day | ORAL | Status: DC | PRN
  Administered 2018-09-26: 1 mg via ORAL
  Filled 2018-09-26: qty 1

## 2018-09-26 MED ORDER — SIMETHICONE 80 MG PO CHEW
80.0000 mg | CHEWABLE_TABLET | Freq: Four times a day (QID) | ORAL | Status: DC | PRN
  Administered 2018-09-26: 80 mg via ORAL
  Filled 2018-09-26: qty 1

## 2018-09-26 MED ORDER — TRAMADOL HCL 50 MG PO TABS
50.0000 mg | ORAL_TABLET | Freq: Four times a day (QID) | ORAL | Status: DC | PRN
  Administered 2018-09-26 – 2018-09-27 (×2): 50 mg via ORAL
  Filled 2018-09-26 (×2): qty 1

## 2018-09-26 MED ORDER — POTASSIUM CHLORIDE CRYS ER 20 MEQ PO TBCR
40.0000 meq | EXTENDED_RELEASE_TABLET | Freq: Once | ORAL | Status: AC
  Administered 2018-09-26: 40 meq via ORAL
  Filled 2018-09-26: qty 2

## 2018-09-26 MED ORDER — SODIUM CHLORIDE 0.9 % IV SOLN
INTRAVENOUS | Status: AC
  Administered 2018-09-26 (×2): via INTRAVENOUS

## 2018-09-26 MED ORDER — ALUM & MAG HYDROXIDE-SIMETH 200-200-20 MG/5ML PO SUSP
15.0000 mL | Freq: Four times a day (QID) | ORAL | Status: DC | PRN
  Administered 2018-09-26: 15 mL via ORAL
  Filled 2018-09-26: qty 30

## 2018-09-26 NOTE — Consult Note (Signed)
[facility] Face-to-Face Psychiatry Consult   Reason for Consult:  Drug overdose  Referring Physician:  Dr. Jerolyn Center  Patient Identification: Diana Goodman MRN:  [id number] Principal Diagnosis: Drug overdose, accidental or unintentional, initial encounter Diagnosis:  Principal Problem:   Tylenol overdose Active Problems:   Suicide ideation   Total Time spent with patient: 1 hour  Subjective:   Diana Goodman is a 42 y.o. female patient admitted with drug overdose.  HPI:   Per chart review, patient was admitted with drug overdose. She ingested 20 tablets of Fioricet and an unknown amount of Klonopin. She reports contacting her friend after her overdose. Home medications include Topamax 50 mg BID, Klonopin 1 mg qhs, Lexapro 10 mg daily and Risperdal 3 mg qhs. BAL was negative and UDS was positive for barbiturates on admission. APAP was 54 on admission. She received a NAC drip.   On interview, Ms. Gasner reports taking a couple of Klonopin and several Fioricet over a brief period of time due to having a migraine that would not go away.  She denies any intention to harm her self.  She reports that she initially took 9 tablets but it did not improve her headache so she took the remaining tablets to total about 20 tablets.  She reports that she has been misusing this medication for a while.  She is prescribed to take about 2 tablets daily but she has been taking up to 6 daily.  She briefly stopped misusing it after her pharmacist told her that it could affect her "vessels."  She reports that she recently had a fight with her mom on Christmas and identifies this argument as the cause of her migraine.  She denies any intention to harm herself or history of suicide attempts.  She is future oriented.  She reports that she would like to try alternative treatments for her migraines but she is unable to afford other options.  She has tried essential oils with minimal relief.  She was prescribed Vicodin a couple  weeks ago after a cholecystectomy.  She reports that it was effective but she does not want to become addicted to pain medications.  She denies current SI, HI or AVH.  She does report a history of anxiety and is prescribed Klonopin by her PCP.  She has been taking it for 5 years.  She is prescribed 1 mg TID PRN but she takes it once daily for sleep and anxiety.  She denies problems with sleep or appetite.  She provides verbal consent to speak to her sister-in-law.    Patient's sister-in-law, Tresa Endo was contacted by phone.  She denies concerns for her safety.  She reports that the patient did call her regarding the amount of Fioricet that she could safely take for her migraine.  Her sister-in-law is a Engineer, civil (consulting).  She has been dealing with migraines for the past year and sees a neurologist.  The medications that she is prescribed are ineffective.  She reports that she can be an irrational person at baseline and exercises poor judgment when it comes to taking her medications as prescribed.  She lacks appropriate safety awareness regarding the potential side effects of medications when misusing them.  She does not believe that she intentionally tried to harm herself and reports that she is actually been doing better since 2005 after her brother passed away.  Past Psychiatric History: Depression, anxiety and ADHD.   Risk to Self:  None. Denies SI.  Risk to Others:  None. Denies  HI. Prior Inpatient Therapy:  Denies  Prior Outpatient Therapy:  She is followed by her PCP, Dr. Noralyn Pick.   Past Medical History:  Past Medical History:  Diagnosis Date  . ADHD   . Anxiety   . Depression   . Headache   . OCD (obsessive compulsive disorder)   . Vitamin D deficiency     Past Surgical History:  Procedure Laterality Date  . CHOLECYSTECTOMY N/A 09/12/2018   Procedure: LAPAROSCOPIC CHOLECYSTECTOMY WITH INTRAOPERATIVE CHOLANGIOGRAM ERAS PATHWAY;  Surgeon: Griselda Miner, MD;  Location: [facility] OR;  Service: General;   Laterality: N/A;   Family History:  Family History  Problem Relation Age of Onset  . Depression Mother   . Anxiety disorder Mother   . Migraines Mother   . Depression Father   . Anxiety disorder Father   . Depression Brother   . Anxiety disorder Brother    Family Psychiatric  History: Mother, father and anxiety-depression and anxiety. Son-autism spectrum disorder.   Social History:  Social History   Substance and Sexual Activity  Alcohol Use No  . Frequency: Never     Social History   Substance and Sexual Activity  Drug Use No    Social History   Socioeconomic History  . Marital status: Widowed    Spouse name: Not on file  . Number of children: 2  . Years of education: Not on file  . Highest education level: Some college, no degree  Occupational History  . Not on file  Social Needs  . Financial resource strain: Not on file  . Food insecurity:    Worry: Not on file    Inability: Not on file  . Transportation needs:    Medical: Not on file    Non-medical: Not on file  Tobacco Use  . Smoking status: Never Smoker  . Smokeless tobacco: Never Used  Substance and Sexual Activity  . Alcohol use: No    Frequency: Never  . Drug use: No  . Sexual activity: Not on file  Lifestyle  . Physical activity:    Days per week: Not on file    Minutes per session: Not on file  . Stress: Not on file  Relationships  . Social connections:    Talks on phone: Not on file    Gets together: Not on file    Attends religious service: Not on file    Active member of club or organization: Not on file    Attends meetings of clubs or organizations: Not on file    Relationship status: Not on file  Other Topics Concern  . Not on file  Social History Narrative   Patient is right-handed. She rarely drinks caffeine. She does not regularly exercise.   Additional Social History: She lives at home with her 42 y/o and 97 y/o child. She is a widow. Her husband passed away 5 years ago. She  draws SSI from her deceased spouse. She denies illicit drug or alcohol use.     Allergies:   Allergies  Allergen Reactions  . Penicillins Nausea And Vomiting    Has patient had a PCN reaction causing immediate rash, facial/tongue/throat swelling, SOB or lightheadedness with hypotension: No Has patient had a PCN reaction causing severe rash involving mucus membranes or skin necrosis: No Has patient had a PCN reaction that required hospitalization: No Has patient had a PCN reaction occurring within the last 10 years: Unknown If all of the above answers are "NO", then may proceed with Cephalosporin  use.     Labs:  Results for orders placed or performed during the hospital encounter of 09/25/18 (from the past 48 hour(s))  Comprehensive metabolic panel     Status: Abnormal   Collection Time: 09/25/18 10:09 PM  Result Value Ref Range   Sodium 140 135 - 145 mmol/L   Potassium 3.2 (L) 3.5 - 5.1 mmol/L   Chloride 113 (H) 98 - 111 mmol/L   CO2 18 (L) 22 - 32 mmol/L   Glucose, Bld 98 70 - 99 mg/dL   BUN 10 6 - 20 mg/dL   Creatinine, Ser 8.110.87 0.44 - 1.00 mg/dL   Calcium 8.9 8.9 - 91.410.3 mg/dL   Total Protein 6.8 6.5 - 8.1 g/dL   Albumin 4.1 3.5 - 5.0 g/dL   AST 16 15 - 41 U/L   ALT 16 0 - 44 U/L   Alkaline Phosphatase 41 38 - 126 U/L   Total Bilirubin 0.4 0.3 - 1.2 mg/dL   GFR calc non Af Amer >60 >60 mL/min   GFR calc Af Amer >60 >60 mL/min   Anion gap 9 5 - 15    Comment: Performed at [facility], [address] [place], [place] [address]  Ethanol     Status: None   Collection Time: 09/25/18 10:09 PM  Result Value Ref Range   Alcohol, Ethyl (B) <10 <10 mg/dL    Comment: (NOTE) Lowest detectable limit for serum alcohol is 10 mg/dL. For medical purposes only. Performed at [facility], [address] [place], [place] [address]   Salicylate level     Status: None   Collection Time: 09/25/18 10:09 PM  Result Value Ref Range   Salicylate Lvl  <7.0 2.8 - 30.0 mg/dL    Comment: Performed at [facility], [address] [place], [place] [address]  Acetaminophen level     Status: Abnormal   Collection Time: 09/25/18 10:09 PM  Result Value Ref Range   Acetaminophen (Tylenol), Serum 54 (H) 10 - 30 ug/mL    Comment: (NOTE) Therapeutic concentrations vary significantly. A range of 10-30 ug/mL  may be an effective concentration for many patients. However, some  are best treated at concentrations outside of this range. Acetaminophen concentrations >150 ug/mL at 4 hours after ingestion  and >50 ug/mL at 12 hours after ingestion are often associated with  toxic reactions. Performed at [facility], [address] [place], [place] [address]   cbc     Status: None   Collection Time: 09/25/18 10:09 PM  Result Value Ref Range   WBC 4.7 4.0 - 10.5 K/uL   RBC 4.23 3.87 - 5.11 MIL/uL   Hemoglobin 13.7 12.0 - 15.0 g/dL   HCT 95.239.6 84.136.0 - 32.446.0 %   MCV 93.6 80.0 - 100.0 fL   MCH 32.4 26.0 - 34.0 pg   MCHC 34.6 30.0 - 36.0 g/dL   RDW 40.112.2 02.711.5 - 25.315.5 %   Platelets 244 150 - 400 K/uL   nRBC 0.0 0.0 - 0.2 %    Comment: Performed at [facility], [address] [place], [place] [address]  CBG monitoring, ED     Status: None   Collection Time: 09/25/18 10:13 PM  Result Value Ref Range   Glucose-Capillary 85 70 - 99 mg/dL  Rapid urine drug screen (hospital performed)     Status: Abnormal   Collection Time: 09/26/18  3:06 AM  Result Value Ref Range   Opiates  NONE DETECTED NONE DETECTED   Cocaine NONE DETECTED NONE DETECTED   Benzodiazepines NONE DETECTED NONE DETECTED   Amphetamines NONE DETECTED NONE DETECTED   Tetrahydrocannabinol NONE DETECTED NONE DETECTED   Barbiturates POSITIVE (A) NONE DETECTED    Comment: (NOTE) DRUG SCREEN FOR MEDICAL PURPOSES ONLY.  IF CONFIRMATION IS NEEDED FOR ANY PURPOSE, NOTIFY LAB WITHIN 5 DAYS. LOWEST DETECTABLE LIMITS FOR URINE DRUG  SCREEN Drug Class                     Cutoff (ng/mL) Amphetamine and metabolites    1000 Barbiturate and metabolites    200 Benzodiazepine                 200 Tricyclics and metabolites     300 Opiates and metabolites        300 Cocaine and metabolites        300 THC                            50 Performed at [facility], [address], [place], [place] [address]   Pregnancy, urine     Status: None   Collection Time: 09/26/18  3:06 AM  Result Value Ref Range   Preg Test, Ur NEGATIVE NEGATIVE    Comment:        THE SENSITIVITY OF THIS METHODOLOGY IS >20 mIU/mL. Performed at [facility], [address], [place], [place] [address]   CBG monitoring, ED     Status: None   Collection Time: 09/26/18  3:56 AM  Result Value Ref Range   Glucose-Capillary 97 70 - 99 mg/dL  MRSA PCR Screening     Status: None   Collection Time: 09/26/18  4:31 AM  Result Value Ref Range   MRSA by PCR NEGATIVE NEGATIVE    Comment:        The GeneXpert MRSA Assay (FDA approved for NASAL specimens only), is one component of a comprehensive MRSA colonization surveillance program. It is not intended to diagnose MRSA infection nor to guide or monitor treatment for MRSA infections. Performed at [facility], [address] [place], [place] [address]   Basic metabolic panel     Status: Abnormal   Collection Time: 09/26/18  4:46 AM  Result Value Ref Range   Sodium 141 135 - 145 mmol/L   Potassium 3.6 3.5 - 5.1 mmol/L   Chloride 116 (H) 98 - 111 mmol/L   CO2 16 (L) 22 - 32 mmol/L   Glucose, Bld 105 (H) 70 - 99 mg/dL   BUN 7 6 - 20 mg/dL   Creatinine, Ser 9.14 0.44 - 1.00 mg/dL   Calcium 7.9 (L) 8.9 - 10.3 mg/dL   GFR calc non Af Amer >60 >60 mL/min   GFR calc Af Amer >60 >60 mL/min   Anion gap 9 5 - 15    Comment: Performed at [facility], [address], [place], [place] [address]  Hepatic function panel     Status:  Abnormal   Collection Time: 09/26/18  4:46 AM  Result Value Ref Range   Total Protein 6.2 (L) 6.5 - 8.1 g/dL   Albumin 3.6 3.5 - 5.0 g/dL   AST 13 (L) 15 - 41 U/L   ALT 14 0 - 44 U/L   Alkaline Phosphatase 37 (L) 38 - 126 U/L   Total Bilirubin 0.4 0.3 - 1.2 mg/dL  Bilirubin, Direct <0.1 0.0 - 0.2 mg/dL   Indirect Bilirubin NOT CALCULATED 0.3 - 0.9 mg/dL    Comment: Performed at [facility], [address] [place], [place] [address]  CBC WITH DIFFERENTIAL     Status: None   Collection Time: 09/26/18  4:46 AM  Result Value Ref Range   WBC 5.5 4.0 - 10.5 K/uL   RBC 4.21 3.87 - 5.11 MIL/uL   Hemoglobin 13.2 12.0 - 15.0 g/dL   HCT 47.8 29.5 - 62.1 %   MCV 93.1 80.0 - 100.0 fL   MCH 31.4 26.0 - 34.0 pg   MCHC 33.7 30.0 - 36.0 g/dL   RDW 30.8 65.7 - 84.6 %   Platelets 247 150 - 400 K/uL   nRBC 0.0 0.0 - 0.2 %   Neutrophils Relative % 57 %   Neutro Abs 3.2 1.7 - 7.7 K/uL   Lymphocytes Relative 33 %   Lymphs Abs 1.8 0.7 - 4.0 K/uL   Monocytes Relative 9 %   Monocytes Absolute 0.5 0.1 - 1.0 K/uL   Eosinophils Relative 1 %   Eosinophils Absolute 0.0 0.0 - 0.5 K/uL   Basophils Relative 0 %   Basophils Absolute 0.0 0.0 - 0.1 K/uL   Immature Granulocytes 0 %   Abs Immature Granulocytes 0.01 0.00 - 0.07 K/uL    Comment: Performed at [facility], [address], [place], [place] [address]  Protime-INR     Status: None   Collection Time: 09/26/18  4:46 AM  Result Value Ref Range   Prothrombin Time 15.0 11.4 - 15.2 seconds   INR 1.19     Comment: Performed at [facility], [address] [place], [place] [address]  Glucose, capillary     Status: None   Collection Time: 09/26/18  6:23 AM  Result Value Ref Range   Glucose-Capillary 82 70 - 99 mg/dL  Ammonia     Status: None   Collection Time: 09/26/18  7:00 AM  Result Value Ref Range   Ammonia 11 9 - 35 umol/L    Comment: Performed at [facility], [address] [place], [place] [address]    Current Facility-Administered Medications  Medication Dose Route Frequency Provider Last Rate Last Dose  . 0.9 %  sodium chloride infusion   Intravenous Continuous Eduard Clos, MD 75 mL/hr at 09/26/18 0336    . acetylcysteine (ACETADOTE) 30,000 mg in dextrose 5 % 750 mL (40 mg/mL) infusion  15 mg/kg/hr Intravenous Continuous Eduard Clos, MD 21.9 mL/hr at 09/26/18 0155 15 mg/kg/hr at 09/26/18 0155  . alum & mag hydroxide-simeth (MAALOX/MYLANTA) 200-200-20 MG/5ML suspension 15 mL  15 mL Oral Q6H PRN Alwyn Ren, MD        Musculoskeletal: Strength & Muscle Tone: within normal limits Gait & Station: UTA since patient is lying in bed. Patient leans: N/A  Psychiatric Specialty Exam: Physical Exam  Nursing note and vitals reviewed. Constitutional: She is oriented to person, place, and time. She appears well-developed and well-nourished.  HENT:  Head: Normocephalic and atraumatic.  Neck: Normal range of motion.  Respiratory: Effort normal.  Musculoskeletal: Normal range of motion.  Neurological: She is alert and oriented to person, place, and time.  Psychiatric: She has a normal mood and affect. Her speech is normal and behavior is normal. Thought content normal. Cognition and memory are normal. She expresses impulsivity.    Review of Systems  Cardiovascular: Negative for chest pain.  Gastrointestinal: Positive for heartburn. Negative for abdominal pain, constipation, diarrhea, nausea and vomiting.  Neurological: Positive for headaches.  Psychiatric/Behavioral: Negative for depression, hallucinations, substance abuse and suicidal ideas. The patient is nervous/anxious.   All other systems reviewed and are negative.   Blood pressure 94/72, pulse 81, temperature 98.5 F (36.9 C), resp. rate 18, height 5\' 4"  (1.626 m), weight 60.1 kg, SpO2 99 %.Body mass index is 22.74 kg/m.  General Appearance: Fairly Groomed, middle aged,  Caucasian female, wearing a hospital gown who is lying in bed. NAD.   Eye Contact:  Good  Speech:  Clear and Coherent and Normal Rate  Volume:  Normal  Mood:  Euthymic  Affect:  Appropriate and Congruent  Thought Process:  Goal Directed, Linear and Descriptions of Associations: Intact  Orientation:  Full (Time, Place, and Person)  Thought Content:  Logical  Suicidal Thoughts:  No  Homicidal Thoughts:  No  Memory:  Immediate;   Good Recent;   Good Remote;   Good  Judgement:  Poor  Insight:  Fair  Psychomotor Activity:  Normal  Concentration:  Concentration: Good and Attention Span: Good  Recall:  Good  Fund of Knowledge:  Good  Language:  Good  Akathisia:  No  Handed:  Right  AIMS (if indicated):   N/A  Assets:  Communication Skills Desire for Improvement Financial Resources/Insurance Housing Physical Health Resilience Social Support  ADL's:  Impaired  Cognition:  WNL  Sleep:   Okay   Assessment: Diana Goodman is a 42 y.o. female who was admitted with drug overdose. She denies intentionally trying to harm self. She does report abusing her medication and demonstrates poor judgment in decision making and understanding the seriousness of potential drug side effects when misusing medications. She denies a history of suicide attempts. She denies current SI, HI or AVH. She is future oriented. Obtained collateral from her sister who denies concerns that patient tried to intentionally harm self. She reports that patient is irrational with reasoning. She will assist her in following up with her PCP to find a medication that may better manage her migraines. She does not warrant inpatient psychiatric hospitalization at this time.   Treatment Plan Summary: -Continue Klonopin 1 mg qhs for anxiety/insomnia.  -EKG reviewed and QTc 427. Please closely monitor when starting or increasing QTc prolonging agents.  -Patient is psychiatrically cleared. Psychiatry will sign off on patient at this  time. Please consult psychiatry again as needed.    Disposition: Patient does not meet criteria for psychiatric inpatient admission.  Cherly BeachJacqueline J Norman, DO 09/26/2018 10:42 AM

## 2018-09-26 NOTE — ED Notes (Signed)
Report attempted to be called to ICU/Stepdown and [facility] would [facility].

## 2018-09-26 NOTE — Plan of Care (Signed)
  Problem: Elimination: Goal: Will not experience complications related to urinary retention Outcome: Progressing   Problem: Pain Managment: Goal: General experience of comfort will improve Outcome: Progressing   Problem: Safety: Goal: Ability to remain free from injury will improve Outcome: Progressing   

## 2018-09-26 NOTE — ED Notes (Signed)
Medications counted and sent to pharmacy 

## 2018-09-26 NOTE — ED Notes (Signed)
ED TO INPATIENT HANDOFF REPORT  Name/Age/Gender Diana Goodman 42 y.o. female  Code Status Code Status History    Date Active Date Inactive Code Status Order ID Comments User Context   10/18/2011 0916 10/18/2011 2340 Full Code [id number]  [facility], [facility]      Home/SNF/Other Home  Chief Complaint Overdose  Level of Care/Admitting Diagnosis ED Disposition    ED Disposition Condition Comment   Admit  [facility][id number]  Level of Care: Stepdown [14]  Admit to SDU based on following criteria: Severe physiological/psychological symptoms:  Any diagnosis requiring assessment & intervention at least every 4 hours on an ongoing basis to obtain desired patient outcomes including stability and rehabilitation  Diagnosis: Tylenol overdose [[id number]][[id number]]  Admitting Physician: Eduard ClosKAKRAKANDY, ARSHAD N [phone]]  Attending Physician: Eduard ClosKAKRAKANDY, ARSHAD N [phone]]  Estimated length of stay: past midnight tomorrow  Certification:: I certify this patient will need inpatient services for at least 2 midnights  PT Class (Do Not Modify): Inpatient [101]  PT Acc Code (Do Not Modify): Private [1]       Medical History Past Medical History:  Diagnosis Date  . ADHD   . Anxiety   . Depression   . Headache   . OCD (obsessive compulsive disorder)   . Vitamin D deficiency     Allergies Allergies  Allergen Reactions  . Penicillins Nausea And Vomiting    Has patient had a PCN reaction causing immediate rash, facial/tongue/throat swelling, SOB or lightheadedness with hypotension: No Has patient had a PCN reaction causing severe rash involving mucus membranes or skin necrosis: No Has patient had a PCN reaction that required hospitalization: No Has patient had a PCN reaction occurring within the last 10 years: Unknown If all of the above answers are "NO", then may proceed with Cephalosporin use.     IV Location/Drains/Wounds Patient Lines/Drains/Airways Status   Active  Line/Drains/Airways    Name:   Placement date:   Placement time:   Site:   Days:   Peripheral IV 09/25/18 Left Antecubital   09/25/18    -    Antecubital   1   Incision (Closed) 09/12/18 Abdomen Right   09/12/18    0733     14   Incision (Closed) 09/12/18 Abdomen Right   09/12/18    0733     14   Incision (Closed) 09/12/18 Umbilicus   09/12/18    0733     14   Incision (Closed) 09/12/18 Abdomen   09/12/18    0733     14          Labs/Imaging Results for orders placed or performed during the hospital encounter of 09/25/18 (from the past 48 hour(s))  Comprehensive metabolic panel     Status: Abnormal   Collection Time: 09/25/18 10:09 PM  Result Value Ref Range   Sodium 140 135 - 145 mmol/L   Potassium 3.2 (L) 3.5 - 5.1 mmol/L   Chloride 113 (H) 98 - 111 mmol/L   CO2 18 (L) 22 - 32 mmol/L   Glucose, Bld 98 70 - 99 mg/dL   BUN 10 6 - 20 mg/dL   Creatinine, Ser 5.620.87 0.44 - 1.00 mg/dL   Calcium 8.9 8.9 - 13.010.3 mg/dL   Total Protein 6.8 6.5 - 8.1 g/dL   Albumin 4.1 3.5 - 5.0 g/dL   AST 16 15 - 41 U/L   ALT 16 0 - 44 U/L   Alkaline Phosphatase 41 38 - 126 U/L  Total Bilirubin 0.4 0.3 - 1.2 mg/dL   GFR calc non Af Amer >60 >60 mL/min   GFR calc Af Amer >60 >60 mL/min   Anion gap 9 5 - 15    Comment: Performed at [facility], [address] [place], [place] [address]  Ethanol     Status: None   Collection Time: 09/25/18 10:09 PM  Result Value Ref Range   Alcohol, Ethyl (B) <10 <10 mg/dL    Comment: (NOTE) Lowest detectable limit for serum alcohol is 10 mg/dL. For medical purposes only. Performed at [facility], [address], [place], [place] [address]   Salicylate level     Status: None   Collection Time: 09/25/18 10:09 PM  Result Value Ref Range   Salicylate Lvl <7.0 2.8 - 30.0 mg/dL    Comment: Performed at [facility], [address], [place], [place] [address]  Acetaminophen level     Status: Abnormal    Collection Time: 09/25/18 10:09 PM  Result Value Ref Range   Acetaminophen (Tylenol), Serum 54 (H) 10 - 30 ug/mL    Comment: (NOTE) Therapeutic concentrations vary significantly. A range of 10-30 ug/mL  may be an effective concentration for many patients. However, some  are best treated at concentrations outside of this range. Acetaminophen concentrations >150 ug/mL at 4 hours after ingestion  and >50 ug/mL at 12 hours after ingestion are often associated with  toxic reactions. Performed at [facility], [address], [place], [place] [address]   cbc     Status: None   Collection Time: 09/25/18 10:09 PM  Result Value Ref Range   WBC 4.7 4.0 - 10.5 K/uL   RBC 4.23 3.87 - 5.11 MIL/uL   Hemoglobin 13.7 12.0 - 15.0 g/dL   HCT 29.5 62.1 - 30.8 %   MCV 93.6 80.0 - 100.0 fL   MCH 32.4 26.0 - 34.0 pg   MCHC 34.6 30.0 - 36.0 g/dL   RDW 65.7 84.6 - 96.2 %   Platelets 244 150 - 400 K/uL   nRBC 0.0 0.0 - 0.2 %    Comment: Performed at [facility], [address], [place], [place] [address]  CBG monitoring, ED     Status: None   Collection Time: 09/25/18 10:13 PM  Result Value Ref Range   Glucose-Capillary 85 70 - 99 mg/dL   No results found. EKG Interpretation  Date/Time:  Thursday September 25 2018 23:04:01 EST Ventricular Rate:  65 PR Interval:    QRS Duration: 87 QT Interval:  441 QTC Calculation: 459 R Axis:   66 Text Interpretation:  Sinus rhythm Low voltage, precordial leads Confirmed by Jacalyn Lefevre [phone]) on 09/25/2018 11:06:10 PM   Pending Labs Unresulted Labs (From admission, onward)    Start     Ordered   09/25/18 2211  Pregnancy, urine  ONCE - STAT,   STAT     09/25/18 2210   09/25/18 2149  Rapid urine drug screen (hospital performed)  Once,   STAT     09/25/18 2149          Vitals/Pain Today's Vitals   09/26/18 0101 09/26/18 0130 09/26/18 0200 09/26/18 0230  BP: 102/64 92/62 107/73 99/72  Pulse: 64 61 90 79   Resp: 14 15 15 15   Temp:      TempSrc:      SpO2: 100% 100% 100% 100%  Weight: 58.4 kg     Height: 5\' 6"  (  1.676 m)     PainSc:        Isolation Precautions No active isolations  Medications Medications  acetylcysteine (ACETADOTE) 40 mg/mL load via infusion 8,760 mg (8,760 mg Intravenous Bolus from Bag 09/26/18 0055)    Followed by  acetylcysteine (ACETADOTE) 30,000 mg in dextrose 5 % 750 mL (40 mg/mL) infusion (15 mg/kg/hr  58.4 kg Intravenous New Bag/Given 09/26/18 0155)  sodium chloride 0.9 % bolus 1,000 mL (0 mLs Intravenous Stopped 09/26/18 0216)    Followed by  sodium chloride 0.9 % bolus 1,000 mL (0 mLs Intravenous Stopped 09/25/18 2359)    Mobility walks

## 2018-09-26 NOTE — ED Notes (Signed)
Pt very agitated. Pt is pulling at cardiac wires and IV tubing.

## 2018-09-26 NOTE — ED Notes (Signed)
Placed pt on bedpan to attempt to give urine sample. Pt wanting to get up to BR but too drowsy to walk to BR

## 2018-09-26 NOTE — H&P (Signed)
History and Physical    Diana RegalDeanna R Wixted GNF:[id number] DOB: May 25, 1976 DOA: 09/25/2018  PCP: Paschal Dopparroll, Michael J, PA  Patient coming from: Home.  Chief Complaint: Drug overdose.  HPI: Diana Goodman is a 42 y.o. female with history of ADHD, depression anxiety OCD was brought to the ER after patient had told her friend that she had consumed 20 Fioricet tablets and unknown number of Klonopin.  Prior to reaching the ER patient had consumed this medication at least 12 hours ago.  On the way to the ER patient was found to be agitated and 1 dose of Haldol was given.  ED Course: In the ER patient was encephalopathic and Tylenol level was 54 with normal LFTs and INR.  Salicylate levels were negative.  EKG was showing normal sinus rhythm with QTC of 459 ms and QRS of 87 ms.  Since levels of Tylenol are still positive despite having at least 12 hours apart of consuming poison control advise starting insulin testing.  My exam patient is still lethargic but arousable.  Pupils are reacting to light.  Protecting airway.  Review of Systems: As per HPI, rest all negative.   Past Medical History:  Diagnosis Date  . ADHD   . Anxiety   . Depression   . Headache   . OCD (obsessive compulsive disorder)   . Vitamin D deficiency     Past Surgical History:  Procedure Laterality Date  . CHOLECYSTECTOMY N/A 09/12/2018   Procedure: LAPAROSCOPIC CHOLECYSTECTOMY WITH INTRAOPERATIVE CHOLANGIOGRAM ERAS PATHWAY;  Surgeon: Griselda Mineroth, Paul III, MD;  [facility];  Service: General;  Laterality: N/A;     reports that she has never smoked. She has never used smokeless tobacco. She reports that she does not drink alcohol or use drugs.  Allergies  Allergen Reactions  . Penicillins Nausea And Vomiting    Has patient had a PCN reaction causing immediate rash, facial/tongue/throat swelling, SOB or lightheadedness with hypotension: No Has patient had a PCN reaction causing severe rash involving mucus membranes or skin  necrosis: No Has patient had a PCN reaction that required hospitalization: No Has patient had a PCN reaction occurring within the last 10 years: Unknown If all of the above answers are "NO", then may proceed with Cephalosporin use.     Family History  Problem Relation Age of Onset  . Depression Mother   . Anxiety disorder Mother   . Migraines Mother   . Depression Father   . Anxiety disorder Father   . Depression Brother   . Anxiety disorder Brother     Prior to Admission medications   Medication Sig Start Date End Date Taking? Authorizing Provider  acetaminophen (TYLENOL) 500 MG tablet Take 1,000 mg by mouth daily as needed (stomach pain).    [provider]  Acetaminophen-Caffeine (TENSION HEADACHE RELIEF) 500-65 MG TABS Take 2 tablets by mouth daily as needed (tension headaches).    [provider]  b complex vitamins capsule Take 1 capsule by mouth daily.    [provider]  cetirizine (ZYRTEC) 10 MG tablet Take 10 mg by mouth daily as needed for allergies.    [provider]  clonazePAM (KLONOPIN) 1 MG tablet Take 1 mg by mouth at bedtime.     [provider]  doxylamine, Sleep, (UNISOM) 25 MG tablet Take 50 mg by mouth at bedtime as needed. Pt stated that she takes two 50 mg tabs to total 100mg     [provider]  ergocalciferol (VITAMIN D2)  50000 units capsule Take 50,000 Units by mouth every Friday.     [provider]  escitalopram (LEXAPRO) 20 MG tablet Take 20 mg by mouth daily. 05/05/18   [provider]  HYDROcodone-acetaminophen (NORCO/VICODIN) 5-325 MG tablet Take 1-2 tablets by mouth every 6 (six) hours as needed for moderate pain or severe pain. 09/12/18   Griselda Miner, MD  levonorgestrel (MIRENA) 20 MCG/24HR IUD 1 each by Intrauterine route continuous.    [provider]  Multiple Vitamin (MULTIVITAMIN WITH MINERALS) TABS tablet Take 1 tablet by mouth every evening.     [provider]  ondansetron (ZOFRAN) 4 MG tablet Take 12 mg by mouth 2 (two) times daily as needed for nausea or vomiting.    [provider]  pantoprazole (PROTONIX) 40 MG tablet Take 40 mg by mouth daily.    [provider]  risperiDONE (RISPERDAL) 3 MG tablet Take 1 tablet (3 mg total) by mouth at bedtime. Patient not taking: Reported on 08/25/2018 08/05/18   Melony Overly T, PA-C  rizatriptan (MAXALT-MLT) 10 MG disintegrating tablet Take 1 tablet (10 mg total) by mouth as needed for migraine. May repeat in 2 hours if needed Patient not taking: Reported on 08/25/2018 04/09/18   Drema Dallas, DO  sucralfate (CARAFATE) 1 g tablet Take 1 g by mouth 3 (three) times daily as needed for heartburn. 06/12/18   [provider]  SUMAtriptan (IMITREX) 100 MG tablet May repeat once  in 2 hours if headache persists or recurs. Do not exceed 2 doses over 24 hours. Patient not taking: Reported on 02/21/2018 12/04/17   Dohmeier, Porfirio Mylar, MD  tiZANidine (ZANAFLEX) 2 MG tablet TAKE 1 TABLET BY MOUTH AT BEDTIME FOR 7 DAYS, 1 TABLET 2 TIMES A DAY FOR 7 DAYS THEN 1 TABLET 3 TIMES A DAY Patient taking differently: Take 2 mg by mouth daily as needed (neck / back pain).  08/04/18   Drema Dallas, DO  topiramate (TOPAMAX) 25 MG tablet Take 50 mg by mouth 2 (two) times daily.     [provider]  topiramate (TOPAMAX) 25 MG tablet TAKE 2 TABLETS BY MOUTH TWICE DAILY Patient taking differently: Take 50 mg by mouth 2 (two) times daily.  08/04/18   Drema Dallas, DO    Physical Exam: Vitals:   09/26/18 0130 09/26/18 0200 09/26/18 0230 09/26/18 0300  BP: 92/62 107/73 99/72 105/72  Pulse: 61 90 79 92  Resp: 15 15 15 17   Temp:      TempSrc:      SpO2: 100% 100% 100% 100%  Weight:      Height:          Constitutional: Moderately built and nourished. Vitals:   09/26/18 0130 09/26/18 0200 09/26/18 0230 09/26/18 0300  BP: 92/62 107/73 99/72 105/72  Pulse: 61 90 79 92  Resp: 15 15 15 17     Temp:      TempSrc:      SpO2: 100% 100% 100% 100%  Weight:      Height:       Eyes: Anicteric no pallor. ENMT: No discharge from the ears eyes nose or mouth. Neck: No mass felt.  No neck rigidity. Respiratory: No rhonchi or crepitations. Cardiovascular: S1-S2 heard. Abdomen: Soft nontender bowel sounds present. Musculoskeletal: No edema.  No joint effusion. Skin: No rash. Neurologic: Patient is lethargic encephalopathic arousable but confused pupils are reacting to light. Psychiatric: Encephalopathic.   Labs on Admission: I have personally reviewed following  labs and imaging studies  CBC: Recent Labs  Lab 09/25/18 2209  WBC 4.7  HGB 13.7  HCT 39.6  MCV 93.6  PLT 244   Basic Metabolic Panel: Recent Labs  Lab 09/25/18 2209  NA 140  K 3.2*  CL 113*  CO2 18*  GLUCOSE 98  BUN 10  CREATININE 0.87  CALCIUM 8.9   GFR: Estimated Creatinine Clearance: 77.7 mL/min (by C-G formula based on SCr of 0.87 mg/dL). Liver Function Tests: Recent Labs  Lab 09/25/18 2209  AST 16  ALT 16  ALKPHOS 41  BILITOT 0.4  PROT 6.8  ALBUMIN 4.1   No results for input(s): LIPASE, AMYLASE in the last 168 hours. No results for input(s): AMMONIA in the last 168 hours. Coagulation Profile: No results for input(s): INR, PROTIME in the last 168 hours. Cardiac Enzymes: No results for input(s): CKTOTAL, CKMB, CKMBINDEX, TROPONINI in the last 168 hours. BNP (last 3 results) No results for input(s): PROBNP in the last 8760 hours. HbA1C: No results for input(s): HGBA1C in the last 72 hours. CBG: Recent Labs  Lab 09/25/18 2213  GLUCAP 85   Lipid Profile: No results for input(s): CHOL, HDL, LDLCALC, TRIG, CHOLHDL, LDLDIRECT in the last 72 hours. Thyroid Function Tests: No results for input(s): TSH, T4TOTAL, FREET4, T3FREE, THYROIDAB in the last 72 hours. Anemia Panel: No results for input(s): VITAMINB12, FOLATE, FERRITIN, TIBC, IRON, RETICCTPCT in the last 72 hours. Urine  analysis:    Component Value Date/Time   COLORURINE STRAW (A) 03/17/2018 2059   APPEARANCEUR CLEAR 03/17/2018 2059   LABSPEC 1.010 03/17/2018 2059   PHURINE 6.0 03/17/2018 2059   GLUCOSEU NEGATIVE 03/17/2018 2059   HGBUR SMALL (A) 03/17/2018 2059   BILIRUBINUR NEGATIVE 03/17/2018 2059   KETONESUR NEGATIVE 03/17/2018 2059   PROTEINUR NEGATIVE 03/17/2018 2059   UROBILINOGEN 1.0 10/18/2011 0417   NITRITE NEGATIVE 03/17/2018 2059   LEUKOCYTESUR NEGATIVE 03/17/2018 2059   Sepsis Labs: @LABRCNTIP (procalcitonin:4,lacticidven:4) )No results found for this or any previous visit (from the past 240 hour(s)).   Radiological Exams on Admission: No results found.  EKG: Independently reviewed.  Normal sinus rhythm.  Assessment/Plan Principal Problem:   Tylenol overdose Active Problems:   Suicide ideation    1. Tylenol overdose with other drug overdose including Klonopin and possible Lexapro and Fioricet which also contains butalbital -Poison control advised to observe.  Any agitation to give Ativan but avoid Haldol which can prolong QTC.  Patient has been started on acetylcysteine repeat LFTs and Tylenol levels at 23 hours of starting acetylcysteine.  Closely monitor for any respiratory depression.  Patient will be admitted to stepdown. 2. Suicidal ideation on suicide precautions.  Will need psychiatric consult once awake alert. 3. History of ADHD depression and anxiety.   DVT prophylaxis: SCDs for now. Code Status: Full code. Family Communication: No family at the bedside. Disposition Plan: To be determined. Consults called: None. Admission status: Inpatient.   Eduard ClosArshad N Kakrakandy MD [facility] Pager [phone].  If 7PM-7AM, please contact night-coverage [email or web address] Password [email or web address]  09/26/2018, 3:11 AM

## 2018-09-26 NOTE — ED Notes (Signed)
NS 2000 cc's infused-awaiting acetylcysteine from pharmacy-patient only mumbles she is cold-will not open eyes and converse or answer any questions

## 2018-09-26 NOTE — ED Notes (Signed)
Report attempted to be called again with no answer from floor.

## 2018-09-26 NOTE — Progress Notes (Signed)
MEDICATION RELATED CONSULT NOTE - INITIAL   Pharmacy Consult for Acetadote Indication: Fioricet OD  Allergies  Allergen Reactions  . Penicillins Nausea And Vomiting    Has patient had a PCN reaction causing immediate rash, facial/tongue/throat swelling, SOB or lightheadedness with hypotension: No Has patient had a PCN reaction causing severe rash involving mucus membranes or skin necrosis: No Has patient had a PCN reaction that required hospitalization: No Has patient had a PCN reaction occurring within the last 10 years: Unknown If all of the above answers are "NO", then may proceed with Cephalosporin use.     Patient Measurements: Height: 5\' 6"  (167.6 cm) Weight: 128 lb 12 oz (58.4 kg) IBW/kg (Calculated) : 59.3   Vital Signs: Temp: 98.3 F (36.8 C) (12/26 2205) Temp Source: Oral (12/26 2205) BP: 103/71 (12/27 0330) Pulse Rate: 81 (12/27 0330) Intake/Output from previous day: 12/26 0701 - 12/27 0700 In: 3008.2 [I.V.:1008.2; IV Piggyback:2000] Out: -  Intake/Output from this shift: Total I/O In: 3008.2 [I.V.:1008.2; IV Piggyback:2000] Out: -   Labs: Recent Labs    09/25/18 2209  WBC 4.7  HGB 13.7  HCT 39.6  PLT 244  CREATININE 0.87  ALBUMIN 4.1  PROT 6.8  AST 16  ALT 16  ALKPHOS 41  BILITOT 0.4   Estimated Creatinine Clearance: 77.7 mL/min (by C-G formula based on SCr of 0.87 mg/dL).   Microbiology: No results found for this or any previous visit (from the past 720 hour(s)).  Medical History: Past Medical History:  Diagnosis Date  . ADHD   . Anxiety   . Depression   . Headache   . OCD (obsessive compulsive disorder)   . Vitamin D deficiency     Medications:  Scheduled:   Infusions:  . sodium chloride 75 mL/hr at 09/26/18 0336  . acetylcysteine 15 mg/kg/hr (09/26/18 0155)    Assessment: 42 yoF s/p ingestion of 20 Fioricet tablets and unknown amount of Klonipin. Began taking ~ 0730 on 12/26. 2209 Apap = 54. ED contacted PC and it was  recommended to start Acetadote.   Goal of Therapy:  Prevent liver toxicity  Plan:  Acetadote 150 mg/kg load x1 then 15 mg/kg/hr  F/u Apap, CMET and PT/INR level at ~ 23 hours  F/u PC recommendations  Susanne GreenhouseGreen, Alida Greiner R 09/26/2018,4:11 AM

## 2018-09-26 NOTE — Plan of Care (Signed)
42 year old female admitted after drug overdose from Fioricet and Klonopin.  Since her Tylenol level was 54 she was started on N-acetylcysteine per poison control.  Labs at the time of admission noted and stable.  Will recheck Tylenol levels and CMP this afternoon.  Avoid drugs causing QTC prolongation.  Okay with Ativan.  Patient was awake and alert when I saw her this morning her only question was why it makes her sleepy.  Will consult psych due to depression and suicidal intention and anxiety.

## 2018-09-26 NOTE — ED Notes (Signed)
Pt removed purwick.

## 2018-09-27 DIAGNOSIS — T50901A Poisoning by unspecified drugs, medicaments and biological substances, accidental (unintentional), initial encounter: Secondary | ICD-10-CM

## 2018-09-27 LAB — MAGNESIUM: Magnesium: 1.9 mg/dL (ref 1.7–2.4)

## 2018-09-27 LAB — COMPREHENSIVE METABOLIC PANEL
ALT: 13 U/L (ref 0–44)
ALT: 14 U/L (ref 0–44)
AST: 13 U/L — ABNORMAL LOW (ref 15–41)
AST: 15 U/L (ref 15–41)
Albumin: 3.2 g/dL — ABNORMAL LOW (ref 3.5–5.0)
Albumin: 3.4 g/dL — ABNORMAL LOW (ref 3.5–5.0)
Alkaline Phosphatase: 37 U/L — ABNORMAL LOW (ref 38–126)
Alkaline Phosphatase: 40 U/L (ref 38–126)
Anion gap: 7 (ref 5–15)
Anion gap: 7 (ref 5–15)
BUN: 8 mg/dL (ref 6–20)
BUN: 9 mg/dL (ref 6–20)
CHLORIDE: 114 mmol/L — AB (ref 98–111)
CO2: 20 mmol/L — ABNORMAL LOW (ref 22–32)
CO2: 20 mmol/L — ABNORMAL LOW (ref 22–32)
CREATININE: 0.67 mg/dL (ref 0.44–1.00)
Calcium: 8.6 mg/dL — ABNORMAL LOW (ref 8.9–10.3)
Calcium: 8.7 mg/dL — ABNORMAL LOW (ref 8.9–10.3)
Chloride: 113 mmol/L — ABNORMAL HIGH (ref 98–111)
Creatinine, Ser: 0.71 mg/dL (ref 0.44–1.00)
GFR calc Af Amer: >60 mL/min (ref >60)
GFR calc Af Amer: >60 mL/min (ref >60)
GFR calc non Af Amer: >60 mL/min (ref >60)
GFR calc non Af Amer: >60 mL/min (ref >60)
Glucose, Bld: 101 mg/dL — ABNORMAL HIGH (ref 70–99)
Glucose, Bld: 105 mg/dL — ABNORMAL HIGH (ref 70–99)
Potassium: 3.7 mmol/L (ref 3.5–5.1)
Potassium: 4.1 mmol/L (ref 3.5–5.1)
Sodium: 140 mmol/L (ref 135–145)
Sodium: 141 mmol/L (ref 135–145)
Total Bilirubin: 0.1 mg/dL — ABNORMAL LOW (ref 0.3–1.2)
Total Bilirubin: 0.2 mg/dL — ABNORMAL LOW (ref 0.3–1.2)
Total Protein: 5.6 g/dL — ABNORMAL LOW (ref 6.5–8.1)
Total Protein: 6 g/dL — ABNORMAL LOW (ref 6.5–8.1)

## 2018-09-27 LAB — PROTIME-INR
INR: 1.17
Prothrombin Time: 14.8 seconds (ref 11.4–15.2)

## 2018-09-27 LAB — CBC
HCT: 35.9 % — ABNORMAL LOW (ref 36.0–46.0)
Hemoglobin: 11.8 g/dL — ABNORMAL LOW (ref 12.0–15.0)
MCH: 31.4 pg (ref 26.0–34.0)
MCHC: 32.9 g/dL (ref 30.0–36.0)
MCV: 95.5 fL (ref 80.0–100.0)
Platelets: 235 10*3/uL (ref 150–400)
RBC: 3.76 MIL/uL — ABNORMAL LOW (ref 3.87–5.11)
RDW: 12.2 % (ref 11.5–15.5)
WBC: 6.4 10*3/uL (ref 4.0–10.5)
nRBC: 0 % (ref 0.0–0.2)

## 2018-09-27 LAB — GLUCOSE, CAPILLARY
Glucose-Capillary: 98 mg/dL (ref 70–99)
Glucose-Capillary: 96 mg/dL (ref 70–99)

## 2018-09-27 LAB — ACETAMINOPHEN LEVEL: Acetaminophen (Tylenol), Serum: <10 ug/mL — ABNORMAL LOW (ref 10–30)

## 2018-09-27 MED ORDER — TRAMADOL HCL 50 MG PO TABS
50.0000 mg | ORAL_TABLET | Freq: Once | ORAL | Status: AC
  Administered 2018-09-27: 50 mg via ORAL
  Filled 2018-09-27: qty 1

## 2018-09-27 NOTE — Discharge Summary (Signed)
Physician Discharge Summary  Diana Goodman WJX:[id number] DOB: 03/27/1976 DOA: 09/25/2018  PCP: Paschal Dopp, PA  Admit date: 09/25/2018 Discharge date: 09/27/2018  Admitted From: Home Disposition: Home Recommendations for Outpatient Follow-up:  1. Follow up with PCP in 1-2 weeks 2. Please obtain BMP/CBC in one week 3. Follow-up with a psychiatrist and a psychologist  Home Health none Equipment/Devices none  Discharge Condition stable CODE STATUS full code Diet recommendation: Cardiac Brief/Interim Summary: 42 y.o. female with history of ADHD, depression anxiety OCD was brought to the ER after patient had told her friend that she had consumed 20 Fioricet tablets and unknown number of Klonopin.  Prior to reaching the ER patient had consumed this medication at least 12 hours ago.  On the way to the ER patient was found to be agitated and 1 dose of Haldol was given.  ED Course: In the ER patient was encephalopathic and Tylenol level was 54 with normal LFTs and INR.  Salicylate levels were negative.  EKG was showing normal sinus rhythm with QTC of 459 ms and QRS of 87 ms.  Since levels of Tylenol are still positive despite having at least 12 hours apart of consuming poison control advise starting insulin testing.  My exam patient is still lethargic but arousable.  Pupils are reacting to light.  Protecting airway. Discharge Diagnoses:  Principal Problem:   Drug overdose, accidental or unintentional, initial encounter Active Problems:   Tylenol overdose   Suicide ideation    #1 overdose not suicide intentional.  Appears that she took 40 Tylenol, Klonopin and Fioricet.  She reported that she got a prescription for Fioricet 5 days prior to admission to the hospital she had 60 tablets and she took all the 60 tablets of Fioricet and that 5 days.  She reports she took all this to relieve her headache.  She clearly had no suicidal intention according to her.  She wants to live take care of  her children take care of herself.  Her husband passed away with a heart attack 5 years ago.  But she reports she felt suicidal when he passed away but has not had any such thoughts since then.  She was admitted to stepdown unit treated with N-acetylcysteine follow recommendations from poison control.  Her Tylenol levels at the time of admission was 54 and came down to less than 10 at the time of discharge.  LFTs remained stable and normal.  She is awake alert at this time she will follow-up with her PCP who will need to refer her to a psychiatrist and psychologist.  Patient does have history of ADHD depression anxiety.  She is on Lexapro which can be continued.  She will not be given any  prescription for narcotics.   Estimated body mass index is 22.74 kg/m as calculated from the following:   Height as of this encounter: 5\' 4"  (1.626 m).   Weight as of this encounter: 60.1 kg.  Discharge Instructions  Discharge Instructions    Call MD for:  difficulty breathing, headache or visual disturbances   Complete by:  As directed    Call MD for:  severe uncontrolled pain   Complete by:  As directed    Call MD for:  temperature >100.4   Complete by:  As directed    Diet - low sodium heart healthy   Complete by:  As directed    Increase activity slowly   Complete by:  As directed      Allergies  as of 09/27/2018      Reactions   Penicillins Nausea And Vomiting   Has patient had a PCN reaction causing immediate rash, facial/tongue/throat swelling, SOB or lightheadedness with hypotension: No Has patient had a PCN reaction causing severe rash involving mucus membranes or skin necrosis: No Has patient had a PCN reaction that required hospitalization: No Has patient had a PCN reaction occurring within the last 10 years: Unknown If all of the above answers are "NO", then may proceed with Cephalosporin use.      Medication List    STOP taking these medications   clonazePAM 1 MG tablet Commonly  known as:  KLONOPIN   HYDROcodone-acetaminophen 5-325 MG tablet Commonly known as:  NORCO/VICODIN   risperiDONE 3 MG tablet Commonly known as:  RISPERDAL   rizatriptan 10 MG disintegrating tablet Commonly known as:  MAXALT-MLT   SUMAtriptan 100 MG tablet Commonly known as:  IMITREX   TENSION HEADACHE RELIEF 500-65 MG Tabs Generic drug:  Acetaminophen-Caffeine   tiZANidine 2 MG tablet Commonly known as:  ZANAFLEX   topiramate 25 MG tablet Commonly known as:  TOPAMAX     TAKE these medications   ergocalciferol 1.25 MG (50000 UT) capsule Commonly known as:  VITAMIN D2 Take 50,000 Units by mouth every Friday.   escitalopram 10 MG tablet Commonly known as:  LEXAPRO Take 10 mg by mouth daily.   levonorgestrel 20 MCG/24HR IUD Commonly known as:  MIRENA 1 each by Intrauterine route continuous.   multivitamin with minerals Tabs tablet Take 1 tablet by mouth every evening.      Follow-up Information    Paschal DoppCarroll, Michael J, PA Follow up.   Specialty:  Physician Assistant Why:  SHE NEEDS TO SEE A PSYCHIATRIST AND A PSYCHOLOGIST  PLEASE GIVE HER A REFERRAL Contact information: [address]  [place] [id number] [phone]          Allergies  Allergen Reactions  . Penicillins Nausea And Vomiting    Has patient had a PCN reaction causing immediate rash, facial/tongue/throat swelling, SOB or lightheadedness with hypotension: No Has patient had a PCN reaction causing severe rash involving mucus membranes or skin necrosis: No Has patient had a PCN reaction that required hospitalization: No Has patient had a PCN reaction occurring within the last 10 years: Unknown If all of the above answers are "NO", then may proceed with Cephalosporin use.     Consultations: Psychiatry Procedures/Studies: Dg Cholangiogram Operative  Result Date: 09/12/2018 CLINICAL DATA:  42 year old female undergoing elective cholecystectomy. EXAM: INTRAOPERATIVE CHOLANGIOGRAM  TECHNIQUE: Cholangiographic images from the C-arm fluoroscopic device were submitted for interpretation post-operatively. Please see the procedural report for the amount of contrast and the fluoroscopy time utilized. COMPARISON:  None. FINDINGS: A single cine clip is submitted for interpretation. The images demonstrate cannulation of the cystic duct remanent and opacification of the biliary tree. No evidence of biliary ductal dilatation, stenosis, stricture or choledocholithiasis. Contrast passes freely through the ampulla and into the duodenum. IMPRESSION: Negative intraoperative cholangiogram. Electronically Signed   By: Malachy MoanHeath  McCullough M.D.   On: 09/12/2018 11:05    (Echo, Carotid, EGD, Colonoscopy, ERCP)    Subjective: Complains of headache  Discharge Exam: Vitals:   09/27/18 0400 09/27/18 0725  BP: 97/64   Pulse: 85   Resp: (!) 32   Temp:  97.8 F (36.6 C)  SpO2: 100%    Vitals:   09/27/18 0000 09/27/18 0327 09/27/18 0400 09/27/18 0725  BP: 104/65  97/64   Pulse: 91  85  Resp: 18  (!) 32   Temp:  98.2 F (36.8 C)  97.8 F (36.6 C)  TempSrc:  Oral  Oral  SpO2: 100%  100%   Weight:      Height:        General: Pt is alert, awake, not in acute distress Cardiovascular: RRR, S1/S2 +, no rubs, no gallops Respiratory: CTA bilaterally, no wheezing, no rhonchi Abdominal: Soft, NT, ND, bowel sounds + Extremities: no edema, no cyanosis    The results of significant diagnostics from this hospitalization (including imaging, microbiology, ancillary and laboratory) are listed below for reference.     Microbiology: Recent Results (from the past 240 hour(s))  MRSA PCR Screening     Status: None   Collection Time: 09/26/18  4:31 AM  Result Value Ref Range Status   MRSA by PCR NEGATIVE NEGATIVE Final    Comment:        The GeneXpert MRSA Assay (FDA approved for NASAL specimens only), is one component of a comprehensive MRSA colonization surveillance program. It is  not intended to diagnose MRSA infection nor to guide or monitor treatment for MRSA infections. Performed at [facility], [address] [place], [place] [address]      Labs: BNP (last 3 results) No results for input(s): BNP in the last 8760 hours. Basic Metabolic Panel: Recent Labs  Lab 09/25/18 2209 09/26/18 0446 09/26/18 1155 09/27/18 0014 09/27/18 0314  NA 140 141 141 140 141  K 3.2* 3.6 3.5 3.7 4.1  CL 113* 116* 116* 113* 114*  CO2 18* 16* 16* 20* 20*  GLUCOSE 98 105* 144* 101* 105*  BUN 10 7 5* 8 9  CREATININE 0.87 0.76 0.72 0.71 0.67  CALCIUM 8.9 7.9* 8.4* 8.6* 8.7*  MG  --   --   --   --  1.9   Liver Function Tests: Recent Labs  Lab 09/25/18 2209 09/26/18 0446 09/26/18 1155 09/27/18 0014 09/27/18 0314  AST 16 13* 15 15 13*  ALT 16 14 16 14 13   ALKPHOS 41 37* 40 40 37*  BILITOT 0.4 0.4 0.5 0.2* 0.1*  PROT 6.8 6.2* 6.3* 6.0* 5.6*  ALBUMIN 4.1 3.6 3.6 3.4* 3.2*   No results for input(s): LIPASE, AMYLASE in the last 168 hours. Recent Labs  Lab 09/26/18 0700  AMMONIA 11   CBC: Recent Labs  Lab 09/25/18 2209 09/26/18 0446 09/27/18 0314  WBC 4.7 5.5 6.4  NEUTROABS  --  3.2  --   HGB 13.7 13.2 11.8*  HCT 39.6 39.2 35.9*  MCV 93.6 93.1 95.5  PLT 244 247 235   Cardiac Enzymes: No results for input(s): CKTOTAL, CKMB, CKMBINDEX, TROPONINI in the last 168 hours. BNP: Invalid input(s): POCBNP CBG: Recent Labs  Lab 09/26/18 0623 09/26/18 1131 09/26/18 1750 09/26/18 2320 09/27/18 0621  GLUCAP 82 77 216* 99 98   D-Dimer No results for input(s): DDIMER in the last 72 hours. Hgb A1c No results for input(s): HGBA1C in the last 72 hours. Lipid Profile No results for input(s): CHOL, HDL, LDLCALC, TRIG, CHOLHDL, LDLDIRECT in the last 72 hours. Thyroid function studies No results for input(s): TSH, T4TOTAL, T3FREE, THYROIDAB in the last 72 hours.  Invalid input(s): FREET3 Anemia work up No results for input(s): VITAMINB12,  FOLATE, FERRITIN, TIBC, IRON, RETICCTPCT in the last 72 hours. Urinalysis    Component Value Date/Time   COLORURINE STRAW (A) 03/17/2018 2059   APPEARANCEUR CLEAR 03/17/2018 2059   LABSPEC 1.010 03/17/2018 2059   PHURINE  6.0 03/17/2018 2059   GLUCOSEU NEGATIVE 03/17/2018 2059   HGBUR SMALL (A) 03/17/2018 2059   BILIRUBINUR NEGATIVE 03/17/2018 2059   KETONESUR NEGATIVE 03/17/2018 2059   PROTEINUR NEGATIVE 03/17/2018 2059   UROBILINOGEN 1.0 10/18/2011 0417   NITRITE NEGATIVE 03/17/2018 2059   LEUKOCYTESUR NEGATIVE 03/17/2018 2059   Sepsis Labs Invalid input(s): PROCALCITONIN,  WBC,  LACTICIDVEN Microbiology Recent Results (from the past 240 hour(s))  MRSA PCR Screening     Status: None   Collection Time: 09/26/18  4:31 AM  Result Value Ref Range Status   MRSA by PCR NEGATIVE NEGATIVE Final    Comment:        The GeneXpert MRSA Assay (FDA approved for NASAL specimens only), is one component of a comprehensive MRSA colonization surveillance program. It is not intended to diagnose MRSA infection nor to guide or monitor treatment for MRSA infections. Performed at [facility], [address] [place], [place] [address]      Time coordinating discharge:  30 minutes  SIGNED:   Alwyn RenElizabeth G Rehana Uncapher, MD  [facility] 09/27/2018, 10:01 AM Pager   If 7PM-7AM, please contact night-coverage [email or web address] Password [email or web address]

## 2018-10-02 NOTE — Progress Notes (Signed)
NEUROLOGY FOLLOW UP OFFICE NOTE  Diana Goodman [id number]  HISTORY OF PRESENT ILLNESS: Diana Goodman is a 43 year old right-handed female with depression and anxiety and elevated liver enzymes who follows up for chronic migraine and tension type headaches.  UPDATE: She was admitted to the hospital a couple of weeks ago for drug overdose.  She had reportedly consumed 20 Fioricet tablets and unknown number of clonazepam.  She reports she was trying to treat her headache and was not a suicide attempt.  Headaches are constant.  She is going to have her Mirena taken out.  She read that there has been cases of intracranial hypertension on Mirena and wants to be checked for it.  She also reports recurrence of blurred vision and pulsatile tinnitus.  Prior eye exam was reportedly normal. Current NSAIDS: Contraindicated (gastric ulcers) Current analgesics: None Current triptans: None Current ergotamine: None Current anti-emetic: Promethazine 25 mg Current muscle relaxants: Tizanidine 2 mg 3 times daily Current anti-anxiolytic: clonazepam Current sleep aide: clonazepam Current Antihypertensive medications: None Current Antidepressant medications: Escitalopram 20 mg Current Anticonvulsant medications: topiramate 50mg  twice daily Current anti-CGRP: no Current Vitamins/Herbal/Supplements: D Current Antihistamines/Decongestants: Renatidine Other therapy: Peppermint oil, ice pack Hormone/birth control: Mirena  Caffeine: Rarely Alcohol: No Smoker: No Diet: Drinks water but not enough Exercise: No Depression: Yes; Anxiety: Yes Other pain: No Sleep hygiene: Good with clonazepam  HISTORY:  Onset: January 2019 Location:  Bifrontal/temporal, sometimes occipital Quality:Initially pounding, now non-throbbing pressure Initial intensity:Moderate.Shedenies new headache, thunderclap headache or severe headache that wakes herfrom sleep. Aura:no Prodrome:no Postdrome:no Associated  symptoms: Initially nausea, blurred vision, photophobia, phonophobia.  She now denies any associated symptoms. She denies associated unilateral numbness or weakness. Initial Duration:They were initially constant, but then became episodic, lasting up to 10 days. Initial Frequency:Unclear. Initial Frequency of abortive medication: No medication Triggers/aggravating factors: Emotional stress, laying down makes it worse Relieving factors:  Applying pressure to her temples Activity:aggravates  Past NSAIDS:Contraindicated (ulcer) Past analgesics:Tylenol (ineffective), codeine (ineffective), Fioricet Past abortive triptans:Sumatriptan 100mg  (ineffective), rizatriptan 10mg  Past muscle relaxants:no Past anti-emetic:no Past antihypertensive medications:no Past antidepressant medications:amitriptyline Past anticonvulsant medications:none Past anti-CGRP:  Emgality Past vitamins/Herbal/Supplements:no Past antihistamines/decongestants:no Other past therapies:no  Family history of headaches:  Mother, father  PAST MEDICAL HISTORY: Past Medical History:  Diagnosis Date  . ADHD   . Anxiety   . Depression   . Headache   . OCD (obsessive compulsive disorder)   . Vitamin D deficiency     MEDICATIONS: Current Outpatient Medications on File Prior to Visit  Medication Sig Dispense Refill  . ergocalciferol (VITAMIN D2) 50000 units capsule Take 50,000 Units by mouth every Friday.     . escitalopram (LEXAPRO) 10 MG tablet Take 10 mg by mouth daily.    Marland Kitchen. levonorgestrel (MIRENA) 20 MCG/24HR IUD 1 each by Intrauterine route continuous.    . Multiple Vitamin (MULTIVITAMIN WITH MINERALS) TABS tablet Take 1 tablet by mouth every evening.      No current facility-administered medications on file prior to visit.     ALLERGIES: Allergies  Allergen Reactions  . Penicillins Nausea And Vomiting    Has patient had a PCN reaction causing immediate rash,  facial/tongue/throat swelling, SOB or lightheadedness with hypotension: No Has patient had a PCN reaction causing severe rash involving mucus membranes or skin necrosis: No Has patient had a PCN reaction that required hospitalization: No Has patient had a PCN reaction occurring within the last 10 years: Unknown If all of the above answers are "NO", then  may proceed with Cephalosporin use.     FAMILY HISTORY: Family History  Problem Relation Age of Onset  . Depression Mother   . Anxiety disorder Mother   . Migraines Mother   . Depression Father   . Anxiety disorder Father   . Depression Brother   . Anxiety disorder Brother    SOCIAL HISTORY: Social History   Socioeconomic History  . Marital status: Widowed    Spouse name: Not on file  . Number of children: 2  . Years of education: Not on file  . Highest education level: Some college, no degree  Occupational History  . Not on file  Social Needs  . Financial resource strain: Not on file  . Food insecurity:    Worry: Not on file    Inability: Not on file  . Transportation needs:    Medical: Not on file    Non-medical: Not on file  Tobacco Use  . Smoking status: Never Smoker  . Smokeless tobacco: Never Used  Substance and Sexual Activity  . Alcohol use: No    Frequency: Never  . Drug use: No  . Sexual activity: Not on file  Lifestyle  . Physical activity:    Days per week: Not on file    Minutes per session: Not on file  . Stress: Not on file  Relationships  . Social connections:    Talks on phone: Not on file    Gets together: Not on file    Attends religious service: Not on file    Active member of club or organization: Not on file    Attends meetings of clubs or organizations: Not on file    Relationship status: Not on file  . Intimate partner violence:    Fear of current or ex partner: Not on file    Emotionally abused: Not on file    Physically abused: Not on file    Forced sexual activity: Not on file    Other Topics Concern  . Not on file  Social History Narrative   Patient is right-handed. She rarely drinks caffeine. She does not regularly exercise.    REVIEW OF SYSTEMS: Constitutional: No fevers, chills, or sweats, no generalized fatigue, change in appetite Eyes: No visual changes, double vision, eye pain Ear, nose and throat: No hearing loss, ear pain, nasal congestion, sore throat Cardiovascular: No chest pain, palpitations Respiratory:  No shortness of breath at rest or with exertion, wheezes GastrointestinaI: No nausea, vomiting, diarrhea, abdominal pain, fecal incontinence Genitourinary:  No dysuria, urinary retention or frequency Musculoskeletal:  No neck pain, back pain Integumentary: No rash, pruritus, skin lesions Neurological: as above Psychiatric: No depression, insomnia, anxiety Endocrine: No palpitations, fatigue, diaphoresis, mood swings, change in appetite, change in weight, increased thirst Hematologic/Lymphatic:  No purpura, petechiae. Allergic/Immunologic: no itchy/runny eyes, nasal congestion, recent allergic reactions, rashes  PHYSICAL EXAM: Blood pressure 90/62, pulse 81, height 5\' 4"  (1.626 m), weight 128 lb (58.1 kg), SpO2 98 %. General: No acute distress.  Patient appears well-groomed.   Head:  Normocephalic/atraumatic Eyes:  Fundi examined but not visualized Neck: supple, no paraspinal tenderness, full range of motion Heart:  Regular rate and rhythm Lungs:  Clear to auscultation bilaterally Back: No paraspinal tenderness Neurological Exam: alert and oriented to person, place, and time. Attention span and concentration intact, recent and remote memory intact, fund of knowledge intact.  Speech fluent and not dysarthric, language intact.  CN II-XII intact. Bulk and tone normal, muscle strength 5/5 throughout.  Sensation to light touch  intact.  Deep tendon reflexes 2+ throughout.  Finger to nose testing intact.  Gait normal, Romberg  negative.  IMPRESSION: 1.  Chronic intractable headaches.  May be migraine vs tension type headache.  While I agree Mirena may be the cause of her headaches, I cannot say that it is causing intracranial hypertension.  Prior eye exam was normal.  Brain MRI did not reveal findings consistent with increased intracranial pressure.  I said the next step would be a lumbar puncture.  PLAN: 1.  Proceed with removal of IUD as planned. 2.  She would like to proceed with lumbar puncture to check opening pressure.  We will also check CSF for cell count, gram stain and culture, protein and glucose.  I explained that a possible side effect could be a low-pressure headache. 3.  She will continue topiramate 50mg  twice daily 4.  She is no longer taking Fioricet.  I would not use Fioricet as it easily contributes to rebound headache.  Limit use of pain relievers to no more than 2 days out of week to prevent risk of rebound or medication-overuse headache. However, with persistent headache, pain relievers are not likely to be effective. 5.  Keep headache diary 6.  Recommend psychiatric treatment 7.  She will follow up in next 3 to 4 months.  If headaches persist despite removal of IUD, will need to discuss other treatment options.  Shon Millet, DO  CC: Cletis Media, PA

## 2018-10-03 ENCOUNTER — Encounter: Payer: Self-pay | Admitting: Neurology

## 2018-10-03 ENCOUNTER — Ambulatory Visit: Payer: Medicaid Other | Admitting: Neurology

## 2018-10-03 VITALS — BP 90/62 | HR 81 | Ht 64.0 in | Wt 128.0 lb

## 2018-10-03 DIAGNOSIS — R519 Headache, unspecified: Secondary | ICD-10-CM

## 2018-10-03 DIAGNOSIS — R51 Headache: Secondary | ICD-10-CM

## 2018-10-03 DIAGNOSIS — G8929 Other chronic pain: Secondary | ICD-10-CM

## 2018-10-03 MED ORDER — TOPIRAMATE 50 MG PO TABS: 50.0000 mg | ORAL_TABLET | Freq: Two times a day (BID) | ORAL | 3 refills | Status: DC

## 2018-10-03 NOTE — Patient Instructions (Addendum)
1.  I agree that the Mirena should be removed as it may aggravate headaches. 2.  We will check a spinal tap to evaluate pressure.  We will also check CSF for cell count, gram stain and culture, protein and glucose. 3.  Follow up in the next 3 to 4 months.  If headaches do not improve, we will need to discuss other treatments.  We have sent a referral to [facility] for your spinal tap and they will call you directly to schedule your appt. They are located at [address] [facility]. If you need to contact them directly please call [phone].

## 2018-10-13 ENCOUNTER — Ambulatory Visit
Admission: RE | Admit: 2018-10-13 | Discharge: 2018-10-13 | Disposition: A | Discharge status: Home / Self Care | Payer: Medicaid Other | Source: Ambulatory Visit | Attending: Neurology | Admitting: Neurology

## 2018-10-13 VITALS — BP 102/59 | HR 78

## 2018-10-13 DIAGNOSIS — R51 Headache: Principal | ICD-10-CM

## 2018-10-13 DIAGNOSIS — R519 Headache, unspecified: Secondary | ICD-10-CM

## 2018-10-13 NOTE — Discharge Instructions (Signed)

## 2018-10-15 ENCOUNTER — Telehealth: Payer: Self-pay

## 2018-10-15 NOTE — Telephone Encounter (Signed)
Called and spoke with Pt to advise her of LP results. She was insistent that I give her a dx. I advised her I was giving her the information the doctor asked me to relay to her. She said the spinal tap doctor told her, her dx was intracranial hypertension . I advised her that was the dx used to perform the LP. She said she was the one who told Dr. Everlena Cooper about [facility], that Dr. Everlena Cooper has not done anything for her, she has done all the research herself. I told her I could understand her frustration, no one likes to be in pain. She said since the spinal tap and having the spinal fluid "drained" her head feels better.   I advised her if she is unhappy with the quality of care she has received here, I will forward her records to another neurologist of her choice with a signed release. She states she has an appointment with her PCP tomorrow and intends to ask for a referral to another neurologist.  Pt insists I call her back when I am able to answer her questions as to what her dx is, and if I can't, I am to have someone with the knowledge who can.

## 2018-10-15 NOTE — Telephone Encounter (Signed)
Since it was a borderline elevated pressure of 20 cm and patient reported that her head did feel better after the procedure would it be worth sending for Neurosurgical opinion on her or with everything normal should I just discuss with patient that sometimes there is no root cause for headaches?  Please advise.

## 2018-10-15 NOTE — Telephone Encounter (Signed)
-----   Message from Van Clines, MD sent at 10/14/2018 12:54 PM EST ----- Pls let patient know the results of the spinal tap were unremarkable for elevated pressure, spinal fluid results normal. Thanks

## 2018-10-15 NOTE — Telephone Encounter (Signed)
Since Dr. Everlena Cooper knows her best, would wait until he returns to discuss her questions. Thanks

## 2018-10-16 NOTE — Telephone Encounter (Signed)
Dr. Everlena Cooper- please address.

## 2018-10-17 LAB — CSF CELL COUNT WITH DIFFERENTIAL
RBC Count, CSF: 0 cells/uL (ref 0–10)
WBC, CSF: 3 cells/uL (ref 0–5)

## 2018-10-17 LAB — PROTEIN, CSF: TOTAL PROTEIN, CSF: 41 mg/dL (ref 15–45)

## 2018-10-17 LAB — CSF CULTURE W GRAM STAIN
MICRO NUMBER:: 46248
Result:: NO GROWTH
SPECIMEN QUALITY:: ADEQUATE

## 2018-10-17 LAB — GLUCOSE, CSF: Glucose, CSF: 51 mg/dL (ref 40–80)

## 2018-10-17 LAB — CSF CULTURE

## 2018-10-20 NOTE — Telephone Encounter (Signed)
I called patient and relayed the information from Dr. Everlena Cooper. She did say she already made an appt with another neurologist and hung up the phone on me as I was trying to let her know that we would be happy to forward her records elsewhere.

## 2018-10-20 NOTE — Telephone Encounter (Signed)
The pressure was upper limit of normal.  In setting of reported normal eye exam and normal brain MRI, I do not think she has idiopathic intracranial hypertension.  Also, she is already on topiramate, which is used to treat this condition.  She said she was going to see another neurologist and I think another opinion is warranted.

## 2018-10-31 ENCOUNTER — Telehealth: Payer: Self-pay
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
## 2018-11-13 ENCOUNTER — Ambulatory Visit: Payer: PRIVATE HEALTH INSURANCE | Admitting: Physician Assistant

## 2019-02-02 ENCOUNTER — Ambulatory Visit: Payer: Self-pay | Admitting: Neurology

## 2019-03-16 ENCOUNTER — Ambulatory Visit: Payer: PRIVATE HEALTH INSURANCE | Admitting: Physician Assistant

## 2019-06-14 ENCOUNTER — Other Ambulatory Visit: Payer: Self-pay | Admitting: Neurology

## 2019-06-14 DIAGNOSIS — G8929 Other chronic pain: Secondary | ICD-10-CM

## 2019-06-15 NOTE — Telephone Encounter (Signed)
Requested Prescriptions   Pending Prescriptions Disp Refills  . topiramate (TOPAMAX) 50 MG tablet [Pharmacy Med Name: TOPIRAMATE 50MG  TABLETS] 60 tablet 3    Sig: TAKE 1 TABLET(50 MG) BY MOUTH TWICE DAILY   Rx last filled: 10/03/18 #60 3 REFILS  Pt last seen: 10/03/18  Follow up appt scheduled: NONE

## 2019-07-21 ENCOUNTER — Other Ambulatory Visit: Payer: Self-pay

## 2019-07-21 DIAGNOSIS — Z20822 Contact with and (suspected) exposure to covid-19: Secondary | ICD-10-CM

## 2019-07-23 LAB — NOVEL CORONAVIRUS, NAA: SARS-CoV-2, NAA: NOT DETECTED

## 2019-08-21 ENCOUNTER — Other Ambulatory Visit: Payer: Self-pay | Admitting: Obstetrics and Gynecology

## 2019-08-21 DIAGNOSIS — N83209 Unspecified ovarian cyst, unspecified side: Secondary | ICD-10-CM

## 2019-09-01 ENCOUNTER — Other Ambulatory Visit: Payer: Self-pay | Admitting: Obstetrics and Gynecology

## 2019-09-07 ENCOUNTER — Other Ambulatory Visit: Payer: Medicaid Other

## 2019-09-29 ENCOUNTER — Encounter (HOSPITAL_COMMUNITY): Payer: Self-pay

## 2019-09-29 ENCOUNTER — Emergency Department (HOSPITAL_COMMUNITY)
Admission: EM | Admit: 2019-09-29 | Discharge: 2019-09-30 | Disposition: A | Discharge status: Home / Self Care | Payer: Medicaid Other | Attending: Emergency Medicine | Admitting: Emergency Medicine

## 2019-09-29 ENCOUNTER — Other Ambulatory Visit: Payer: Self-pay

## 2019-09-29 DIAGNOSIS — F1910 Other psychoactive substance abuse, uncomplicated: Secondary | ICD-10-CM | POA: Diagnosis not present

## 2019-09-29 DIAGNOSIS — F10929 Alcohol use, unspecified with intoxication, unspecified: Secondary | ICD-10-CM | POA: Diagnosis present

## 2019-09-29 DIAGNOSIS — F329 Major depressive disorder, single episode, unspecified: Secondary | ICD-10-CM | POA: Diagnosis not present

## 2019-09-29 DIAGNOSIS — F429 Obsessive-compulsive disorder, unspecified: Secondary | ICD-10-CM | POA: Insufficient documentation

## 2019-09-29 DIAGNOSIS — F909 Attention-deficit hyperactivity disorder, unspecified type: Secondary | ICD-10-CM | POA: Diagnosis not present

## 2019-09-29 DIAGNOSIS — Z79899 Other long term (current) drug therapy: Secondary | ICD-10-CM | POA: Insufficient documentation

## 2019-09-29 LAB — I-STAT BETA HCG BLOOD, ED (MC, WL, AP ONLY): I-stat hCG, quantitative: <5 m[IU]/mL (ref <5)

## 2019-09-29 LAB — CBC WITH DIFFERENTIAL/PLATELET
Abs Immature Granulocytes: 0.02 10*3/uL (ref 0.00–0.07)
Basophils Absolute: 0 10*3/uL (ref 0.0–0.1)
Basophils Relative: 0 %
Eosinophils Absolute: 0.1 10*3/uL (ref 0.0–0.5)
Eosinophils Relative: 1 %
HCT: 42.3 % (ref 36.0–46.0)
Hemoglobin: 14 g/dL (ref 12.0–15.0)
Immature Granulocytes: 0 %
Lymphocytes Relative: 19 %
Lymphs Abs: 1.1 10*3/uL (ref 0.7–4.0)
MCH: 31.7 pg (ref 26.0–34.0)
MCHC: 33.1 g/dL (ref 30.0–36.0)
MCV: 95.7 fL (ref 80.0–100.0)
Monocytes Absolute: 0.5 10*3/uL (ref 0.1–1.0)
Monocytes Relative: 8 %
Neutro Abs: 4.1 10*3/uL (ref 1.7–7.7)
Neutrophils Relative %: 72 %
Platelets: 234 10*3/uL (ref 150–400)
RBC: 4.42 MIL/uL (ref 3.87–5.11)
RDW: 11.6 % (ref 11.5–15.5)
WBC: 5.8 10*3/uL (ref 4.0–10.5)
nRBC: 0 % (ref 0.0–0.2)

## 2019-09-29 LAB — COMPREHENSIVE METABOLIC PANEL
ALT: 21 U/L (ref 0–44)
AST: 18 U/L (ref 15–41)
Albumin: 3.6 g/dL (ref 3.5–5.0)
Alkaline Phosphatase: 60 U/L (ref 38–126)
Anion gap: 9 (ref 5–15)
BUN: 11 mg/dL (ref 6–20)
CO2: 25 mmol/L (ref 22–32)
Calcium: 8.9 mg/dL (ref 8.9–10.3)
Chloride: 105 mmol/L (ref 98–111)
Creatinine, Ser: 0.96 mg/dL (ref 0.44–1.00)
GFR calc Af Amer: >60 mL/min (ref >60)
GFR calc non Af Amer: >60 mL/min (ref >60)
Glucose, Bld: 89 mg/dL (ref 70–99)
Potassium: 4.2 mmol/L (ref 3.5–5.1)
Sodium: 139 mmol/L (ref 135–145)
Total Bilirubin: 0.5 mg/dL (ref 0.3–1.2)
Total Protein: 6.9 g/dL (ref 6.5–8.1)

## 2019-09-29 LAB — SALICYLATE LEVEL: Salicylate Lvl: <7 mg/dL — ABNORMAL LOW (ref 7.0–30.0)

## 2019-09-29 LAB — CBG MONITORING, ED: Glucose-Capillary: 87 mg/dL (ref 70–99)

## 2019-09-29 LAB — ACETAMINOPHEN LEVEL: Acetaminophen (Tylenol), Serum: <10 ug/mL — ABNORMAL LOW (ref 10–30)

## 2019-09-29 LAB — ETHANOL: Alcohol, Ethyl (B): <10 mg/dL (ref <10)

## 2019-09-29 NOTE — ED Provider Notes (Signed)
[facility]-EMERGENCY DEPT Provider Note   CSN: [id number] Arrival date & time: 09/29/19  2044     History Chief Complaint  Patient presents with  . Possible Drug Ingestion  . Alcohol Intoxication    Diana Goodman is a 43 y.o. female.  Patient is a 43 year old female who presents with a possible drug ingestion.  She says that she did drink some alcohol but says she only drink to wait because.  She does have a prescription for Klonopin and says she has been taking too much of them as she says she got a prescription filled on December 20 and is currently out although she said she did not take any today because she was out.  She does endorse taking Elavil.  Initially she told me she took 2 Elavil because she had a headache however subsequently she said she may have taken up to 5 Elavil.  She has been sleepy since EMS found her.  She was initially hypotensive but this responded spontaneously.  She denies any intent for self-harm.  She says that she just wanted to get some sleep.  History is limited because patient is very sleepy and has to be continuously stimulated to answer questions.        Past Medical History:  Diagnosis Date  . ADHD   . Anxiety   . Depression   . Headache   . OCD (obsessive compulsive disorder)   . Vitamin D deficiency     Patient Active Problem List   Diagnosis Date Noted  . Suicide ideation 09/26/2018  . Drug overdose, accidental or unintentional, initial encounter   . Tylenol overdose 09/25/2018  . Intractable episodic cluster headache 12/04/2017  . Intractable chronic migraine without aura and without status migrainosus 12/04/2017  . Severe episode of recurrent major depressive disorder, without psychotic features (HCC) 12/04/2017  . Non compliance with medical treatment 12/04/2017    Past Surgical History:  Procedure Laterality Date  . CHOLECYSTECTOMY N/A 09/12/2018   Procedure: LAPAROSCOPIC CHOLECYSTECTOMY WITH  INTRAOPERATIVE CHOLANGIOGRAM ERAS PATHWAY;  Surgeon: Griselda Mineroth, Paul III, MD;  Location: [facility] OR;  Service: General;  Laterality: N/A;     OB History   No obstetric history on file.     Family History  Problem Relation Age of Onset  . Depression Mother   . Anxiety disorder Mother   . Migraines Mother   . Depression Father   . Anxiety disorder Father   . Depression Brother   . Anxiety disorder Brother     Social History   Tobacco Use  . Smoking status: Never Smoker  . Smokeless tobacco: Never Used  Substance Use Topics  . Alcohol use: No  . Drug use: No    Home Medications Prior to Admission medications   Medication Sig Start Date End Date Taking? Authorizing Provider  amitriptyline (ELAVIL) 50 MG tablet Take 50 mg by mouth at bedtime. 09/10/19  Yes [provider]  clonazePAM (KLONOPIN) 1 MG tablet Take 1 mg by mouth at bedtime as needed for sleep. 09/10/19  Yes [provider]  norethindrone-ethinyl estradiol (LOESTRIN) 1-20 MG-MCG tablet Take 1 tablet by mouth daily. 08/24/19  Yes [provider]  topiramate (TOPAMAX) 50 MG tablet TAKE 1 TABLET(50 MG) BY MOUTH TWICE DAILY Patient taking differently: Take 50 mg by mouth 2 (two) times daily.  06/15/19  Yes Drema DallasJaffe, Adam R, DO    Allergies    Penicillins  Review of Systems   Review of Systems  Unable  to perform ROS: Mental status change    Physical Exam Updated Vital Signs BP 97/68 (BP Location: Left Arm)   Pulse 89   Temp 98 F (36.7 C) (Oral)   Resp 12   SpO2 99%   Physical Exam Constitutional:      Appearance: She is well-developed.     Comments: Sleepy but will wake up and answer questions  HENT:     Head: Normocephalic and atraumatic.  Eyes:     Extraocular Movements: Extraocular movements intact.     Pupils: Pupils are equal, round, and reactive to light.  Cardiovascular:     Rate and Rhythm: Normal rate and regular rhythm.     Heart sounds: Normal heart sounds.  Pulmonary:      Effort: Pulmonary effort is normal. No respiratory distress.     Breath sounds: Normal breath sounds. No wheezing or rales.  Chest:     Chest wall: No tenderness.  Abdominal:     General: Bowel sounds are normal.     Palpations: Abdomen is soft.     Tenderness: There is no abdominal tenderness. There is no guarding or rebound.  Musculoskeletal:        General: Normal range of motion.     Cervical back: Normal range of motion and neck supple.     Comments: No clonus  Lymphadenopathy:     Cervical: No cervical adenopathy.  Skin:    General: Skin is warm and dry.     Findings: No rash.  Neurological:     Mental Status: She is oriented to person, place, and time.     ED Results / Procedures / Treatments   Labs (all labs ordered are listed, but only abnormal results are displayed) Labs Reviewed  COMPREHENSIVE METABOLIC PANEL  CBC WITH DIFFERENTIAL/PLATELET  SALICYLATE LEVEL  ACETAMINOPHEN LEVEL  ETHANOL  RAPID URINE DRUG SCREEN, HOSP PERFORMED  CBG MONITORING, ED  I-STAT BETA HCG BLOOD, ED ([place], [facility], [facility] ONLY)    EKG EKG Interpretation  Date/Time:  Tuesday September 29 2019 20:57:12 EST Ventricular Rate:  96 PR Interval:    QRS Duration: 92 QT Interval:  364 QTC Calculation: 460 R Axis:   78 Text Interpretation: Sinus rhythm Low voltage, precordial leads since last tracing no significant change Confirmed by Malvin Johns [phone]) on 09/29/2019 10:20:18 PM   Radiology No results found.  Procedures Procedures (including critical care time)  Medications Ordered in ED Medications - No data to display  ED Course  I have reviewed the triage vital signs and the nursing notes.  Pertinent labs & imaging results that were available during my care of the patient were reviewed by me and considered in my medical decision making (see chart for details).    MDM Rules/Calculators/A&P                      Patient is a 43 year old female who presents after an ingestion.  She  reports drinking alcohol and taking either 2 or 5 Elavil.  She is sleepy but will wake up easily and answer questions.  Her vital signs are currently stable.  Her blood pressure is soft but has responded to some IV fluids.  Her initial EKG does not show any significant changes.  We will have to continue to monitor this to make sure that the QRS does not start to widen.  She will need an extensive observation.  And currently she does not report any suicidal ideations but will need  to be reassessed when she is clinically sober and medically cleared.  Dr. Clayborne Dana to take over care. Final Clinical Impression(s) / ED Diagnoses Final diagnoses:  None    Rx / DC Orders ED Discharge Orders    None       Rolan Bucco, MD 09/29/19 2325

## 2019-09-29 NOTE — ED Triage Notes (Addendum)
Per EMS, patient coming from home with decreased responsiveness and lethargy. Patient is prescribed Klonopin (1 a day) but endorses taking more than prescribed daily. Patient also states that she has been drinking white claws tonight. Patient states that she has not taken any Klonopin tonight but son called EMS due to decreased responsiveness. Patient endorses taking Elavil; she is not sure how many she took but states that she took less than 5. She states she did not take the Klonopin because she is currently out. Denies SI, states that she just wanted to sleep well. Patient is A&O x4 currently but drowsy.

## 2019-09-29 NOTE — ED Provider Notes (Signed)
3:55 AM Assumed care from Dr. Tamera Punt, please see their note for full history, physical and decision making until this point. In brief this is a 43 y.o. year old female who presented to the ED tonight with Possible Drug Ingestion and Alcohol Intoxication     Ethanol and elavil pending reeval and disposition. At this time sounds like she was trying to get relief from insomnia and headache, but need reassess for self harm.  Patient reassessed. Tolerating PO. Denies self harm, just wanted to help with headache and get some sleep. No e/o continued intoxcation or abnormal behavior. Plan for dc when able to get a ride.  Discharge instructions, including strict return precautions for new or worsening symptoms, given. Patient and/or family verbalized understanding and agreement with the plan as described.   Labs, studies and imaging reviewed by myself and considered in medical decision making if ordered. Imaging interpreted by radiology.  Labs Reviewed  SALICYLATE LEVEL - Abnormal; Notable for the following components:      Result Value   Salicylate Lvl <2.5 (*)    All other components within normal limits  ACETAMINOPHEN LEVEL - Abnormal; Notable for the following components:   Acetaminophen (Tylenol), Serum <10 (*)    All other components within normal limits  COMPREHENSIVE METABOLIC PANEL  ETHANOL  RAPID URINE DRUG SCREEN, HOSP PERFORMED  CBC WITH DIFFERENTIAL/PLATELET  CBG MONITORING, ED  I-STAT BETA HCG BLOOD, ED ([facility], [facility], [facility] ONLY)    No orders to display    No follow-ups on file.    Avrohom Mckelvin, Corene Cornea, MD 09/30/19 [phone]

## 2019-09-30 LAB — RAPID URINE DRUG SCREEN, HOSP PERFORMED
Amphetamines: NOT DETECTED
Barbiturates: NOT DETECTED
Benzodiazepines: NOT DETECTED
Cocaine: NOT DETECTED
Opiates: NOT DETECTED
Tetrahydrocannabinol: NOT DETECTED

## 2019-09-30 NOTE — ED Notes (Signed)
Patient provided sandwich and water to drink. Denies any needs at this time.

## 2019-09-30 NOTE — ED Notes (Signed)
Discharge instructions reviewed with patient. Patient asking for "pills;" informed patient that EMS did not bring her medication with them. Patient stable and ambulatory at time of discharge. Patient removed IV herself and is refusing to update vitals or sign for discharge.

## 2019-10-12 ENCOUNTER — Other Ambulatory Visit: Payer: Medicaid Other

## 2019-11-12 ENCOUNTER — Other Ambulatory Visit: Payer: Medicaid Other

## 2019-11-19 ENCOUNTER — Other Ambulatory Visit: Payer: Medicaid Other

## 2020-03-01 ENCOUNTER — Inpatient Hospital Stay: Admission: RE | Admit: 2020-03-01 | Payer: Medicaid Other | Source: Ambulatory Visit

## 2021-05-29 ENCOUNTER — Emergency Department (HOSPITAL_COMMUNITY): Payer: Medicaid Other

## 2021-05-29 ENCOUNTER — Emergency Department (HOSPITAL_COMMUNITY)
Admission: EM | Admit: 2021-05-29 | Discharge: 2021-05-29 | Disposition: A | Discharge status: Home / Self Care | Payer: Medicaid Other | Attending: Emergency Medicine | Admitting: Emergency Medicine

## 2021-05-29 DIAGNOSIS — N9489 Other specified conditions associated with female genital organs and menstrual cycle: Secondary | ICD-10-CM | POA: Diagnosis not present

## 2021-05-29 DIAGNOSIS — Y908 Blood alcohol level of 240 mg/100 ml or more: Secondary | ICD-10-CM | POA: Diagnosis not present

## 2021-05-29 DIAGNOSIS — F10129 Alcohol abuse with intoxication, unspecified: Secondary | ICD-10-CM | POA: Insufficient documentation

## 2021-05-29 DIAGNOSIS — R4182 Altered mental status, unspecified: Secondary | ICD-10-CM

## 2021-05-29 DIAGNOSIS — F10920 Alcohol use, unspecified with intoxication, uncomplicated: Secondary | ICD-10-CM

## 2021-05-29 DIAGNOSIS — R400 Somnolence: Secondary | ICD-10-CM | POA: Diagnosis present

## 2021-05-29 LAB — ETHANOL: Alcohol, Ethyl (B): 266 mg/dL — ABNORMAL HIGH (ref <10)

## 2021-05-29 LAB — CBC WITH DIFFERENTIAL/PLATELET
Abs Immature Granulocytes: 0.02 10*3/uL (ref 0.00–0.07)
Basophils Absolute: 0 10*3/uL (ref 0.0–0.1)
Basophils Relative: 0 %
Eosinophils Absolute: 0 10*3/uL (ref 0.0–0.5)
Eosinophils Relative: 0 %
HCT: 44.4 % (ref 36.0–46.0)
Hemoglobin: 14.8 g/dL (ref 12.0–15.0)
Immature Granulocytes: 0 %
Lymphocytes Relative: 32 %
Lymphs Abs: 2.3 10*3/uL (ref 0.7–4.0)
MCH: 32.3 pg (ref 26.0–34.0)
MCHC: 33.3 g/dL (ref 30.0–36.0)
MCV: 96.9 fL (ref 80.0–100.0)
Monocytes Absolute: 0.5 10*3/uL (ref 0.1–1.0)
Monocytes Relative: 6 %
Neutro Abs: 4.5 10*3/uL (ref 1.7–7.7)
Neutrophils Relative %: 62 %
Platelets: 266 10*3/uL (ref 150–400)
RBC: 4.58 MIL/uL (ref 3.87–5.11)
RDW: 12.1 % (ref 11.5–15.5)
WBC: 7.4 10*3/uL (ref 4.0–10.5)
nRBC: 0 % (ref 0.0–0.2)

## 2021-05-29 LAB — CBG MONITORING, ED: Glucose-Capillary: 83 mg/dL (ref 70–99)

## 2021-05-29 LAB — COMPREHENSIVE METABOLIC PANEL
ALT: 25 U/L (ref 0–44)
AST: 26 U/L (ref 15–41)
Albumin: 3.8 g/dL (ref 3.5–5.0)
Alkaline Phosphatase: 52 U/L (ref 38–126)
Anion gap: 9 (ref 5–15)
BUN: 11 mg/dL (ref 6–20)
CO2: 25 mmol/L (ref 22–32)
Calcium: 8.7 mg/dL — ABNORMAL LOW (ref 8.9–10.3)
Chloride: 109 mmol/L (ref 98–111)
Creatinine, Ser: 0.92 mg/dL (ref 0.44–1.00)
GFR, Estimated: >60 mL/min (ref >60)
Glucose, Bld: 83 mg/dL (ref 70–99)
Potassium: 3.9 mmol/L (ref 3.5–5.1)
Sodium: 143 mmol/L (ref 135–145)
Total Bilirubin: 0.7 mg/dL (ref 0.3–1.2)
Total Protein: 7 g/dL (ref 6.5–8.1)

## 2021-05-29 LAB — I-STAT BETA HCG BLOOD, ED (MC, WL, AP ONLY): I-stat hCG, quantitative: <5 m[IU]/mL (ref <5)

## 2021-05-29 LAB — SALICYLATE LEVEL: Salicylate Lvl: <7 mg/dL — ABNORMAL LOW (ref 7.0–30.0)

## 2021-05-29 LAB — ACETAMINOPHEN LEVEL: Acetaminophen (Tylenol), Serum: <10 ug/mL — ABNORMAL LOW (ref 10–30)

## 2021-05-29 LAB — AMMONIA: Ammonia: 25 umol/L (ref 9–35)

## 2021-05-29 MED ORDER — LACTATED RINGERS IV BOLUS
1000.0000 mL | Freq: Once | INTRAVENOUS | Status: AC
  Administered 2021-05-29: 1000 mL via INTRAVENOUS
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
## 2021-05-29 NOTE — Discharge Instructions (Addendum)
Follow-up closely with your primary doctor for any concerns. Return to the emergency room for new concerns.
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
## 2021-05-29 NOTE — ED Provider Notes (Signed)
[facility] EMERGENCY DEPARTMENT Provider Note   CSN: [id number] Arrival date & time: 05/29/21  1733     History Chief Complaint  Patient presents with   Altered Mental Status    Diana Goodman is a 45 y.o. female.with PMHx depression, anxiety who presents for altered mental status.  EMS was called out to the patient's home by her neighbor, who reportedly found the patient unresponsive on her floor.  When EMS arrived, the patient's neighbor was holding the patient on the floor.  Patient was incredibly lethargic and drowsy at that time and not responding to verbal questions. Patient was subsequently transported to the emergency department for further evaluation.  Upon my evaluation, the patient is profoundly altered, limiting HPI.  She states that she drank alcohol today.  She is unable to provide any other history.      Past Medical History:  Diagnosis Date   ADHD    Anxiety    Depression    Headache    OCD (obsessive compulsive disorder)    Vitamin D deficiency     Patient Active Problem List   Diagnosis Date Noted   Suicide ideation 09/26/2018   Drug overdose, accidental or unintentional, initial encounter    Tylenol overdose 09/25/2018   Intractable episodic cluster headache 12/04/2017   Intractable chronic migraine without aura and without status migrainosus 12/04/2017   Severe episode of recurrent major depressive disorder, without psychotic features (HCC) 12/04/2017   Non compliance with medical treatment 12/04/2017    Past Surgical History:  Procedure Laterality Date   CHOLECYSTECTOMY N/A 09/12/2018   Procedure: LAPAROSCOPIC CHOLECYSTECTOMY WITH INTRAOPERATIVE CHOLANGIOGRAM ERAS PATHWAY;  Surgeon: Griselda Miner, MD;  Location: [facility] OR;  Service: General;  Laterality: N/A;     OB History   No obstetric history on file.     Family History  Problem Relation Age of Onset   Depression Mother    Anxiety disorder Mother    Migraines Mother     Depression Father    Anxiety disorder Father    Depression Brother    Anxiety disorder Brother     Social History   Tobacco Use   Smoking status: Never   Smokeless tobacco: Never  Substance Use Topics   Alcohol use: No   Drug use: No    Home Medications Prior to Admission medications   Medication Sig Start Date End Date Taking? Authorizing Provider  amitriptyline (ELAVIL) 50 MG tablet Take 50 mg by mouth at bedtime. 09/10/19   [provider]  clonazePAM (KLONOPIN) 1 MG tablet Take 1 mg by mouth at bedtime as needed for sleep. 09/10/19   [provider]  norethindrone-ethinyl estradiol (LOESTRIN) 1-20 MG-MCG tablet Take 1 tablet by mouth daily. 08/24/19   [provider]  topiramate (TOPAMAX) 50 MG tablet TAKE 1 TABLET(50 MG) BY MOUTH TWICE DAILY Patient taking differently: Take 50 mg by mouth 2 (two) times daily.  06/15/19   Drema Dallas, DO    Allergies    Penicillins  Review of Systems   Review of Systems  Unable to perform ROS: Mental status change   Physical Exam Updated Vital Signs BP 110/87 (BP Location: Right Arm)   Pulse 91   Temp 98.2 F (36.8 C) (Oral)   Resp 20   SpO2 100%   Physical Exam Vitals and nursing note reviewed.  Constitutional:      General: She is not in acute distress.    Appearance: She is well-developed and  normal weight.     Comments: Disheveled appearing middle-aged woman who appears clinically intoxicated.  Fluctuating alertness.  HENT:     Head: Normocephalic and atraumatic.  Eyes:     Conjunctiva/sclera: Conjunctivae normal.  Cardiovascular:     Rate and Rhythm: Normal rate and regular rhythm.     Heart sounds: No murmur heard. Pulmonary:     Effort: Pulmonary effort is normal. No respiratory distress.     Breath sounds: Normal breath sounds.  Abdominal:     Palpations: Abdomen is soft.     Tenderness: There is no abdominal tenderness.  Musculoskeletal:     Cervical back: Neck supple.  Skin:     General: Skin is warm and dry.  Neurological:     General: No focal deficit present.     Mental Status: She is alert. She is disoriented.     Cranial Nerves: No cranial nerve deficit.     Sensory: No sensory deficit.     Motor: No weakness.    ED Results / Procedures / Treatments   Labs (all labs ordered are listed, but only abnormal results are displayed) Labs Reviewed  COMPREHENSIVE METABOLIC PANEL - Abnormal; Notable for the following components:      Result Value   Calcium 8.7 (*)    All other components within normal limits  ETHANOL - Abnormal; Notable for the following components:   Alcohol, Ethyl (B) 266 (*)    All other components within normal limits  ACETAMINOPHEN LEVEL - Abnormal; Notable for the following components:   Acetaminophen (Tylenol), Serum <10 (*)    All other components within normal limits  SALICYLATE LEVEL - Abnormal; Notable for the following components:   Salicylate Lvl <7.0 (*)    All other components within normal limits  CBC WITH DIFFERENTIAL/PLATELET  AMMONIA  CBG MONITORING, ED  I-STAT BETA HCG BLOOD, ED ([facility], [facility], [facility] ONLY)    EKG EKG Interpretation  Date/Time:  Monday May 29 2021 17:43:45 EDT Ventricular Rate:  94 PR Interval:  135 QRS Duration: 89 QT Interval:  355 QTC Calculation: 444 R Axis:   87 Text Interpretation: Sinus rhythm Low voltage, precordial leads No significant change since last tracing Confirmed by Linwood Dibbles [phone]) on 05/30/2021 2:32:44 PM  Radiology DG Chest 1 View  Result Date: 05/29/2021 CLINICAL DATA:  Unresponsive EXAM: CHEST  1 VIEW COMPARISON:  None. FINDINGS: Normal mediastinum and cardiac silhouette. Normal pulmonary vasculature. No evidence of effusion, infiltrate, or pneumothorax. No acute bony abnormality. IMPRESSION: No acute cardiopulmonary process. Electronically Signed   By: Genevive Bi M.D.   On: 05/29/2021 19:36    Procedures Procedures   Medications Ordered in ED Medications  lactated  ringers bolus 1,000 mL (0 mLs Intravenous Stopped 05/29/21 2145)    ED Course  I have reviewed the triage vital signs and the nursing notes.  Pertinent labs & imaging results that were available during my care of the patient were reviewed by me and considered in my medical decision making (see chart for details).    MDM Rules/Calculators/A&P                          45 y.o. female with significant past medical history as above who presents for altered mental status in the context of suspected ingestion.  Normoglycemic in the field.  Narcan was not given.  Hemodynamically stable and saturating on room air on arrival.  Patient is significantly encephalopathic but has no focal  neurologic deficits on exam.  EKG was normal sinus rhythm.  CBC was unremarkable.  CMP normal as well.  Ethanol elevated to 66.  Acetaminophen and salicylate levels were normal.  Ethanol significantly elevated at 266.  Chest x-ray with no acute cardiopulmonary abnormality.  Patient was given LR bolus here in the emergency department.  When the patient was taken back for CT head, the patient refused, stating that she wants to leave to take care of her children.  She then got up and ambulated herself back to her room.  She then called her family member to pick her up.  Upon reassessment, the patient is now alert, oriented, and GCS 15.  She states that her boyfriend broke up with her earlier today and that she took 2 of her prescribed Klonopin to try to fall asleep.  She adamantly denies SI or HI, stating that she has 2 children to take care of and she would never hurt herself.  I do not believe that the patient represents a threat to herself or others at this time.  The patient states that she feels safe at home.  The patient's neighbor has arrived at the bedside as well.  He states that when he found her she was very hard to arouse so he called EMS.  Patient reiterates that she is adamant to leave at this time.  Given her improved  mentation as well as that her neighbor will be taking her home and keeping an eye on her, I feel this is appropriate.  Clinical impression and recommendations were reviewed at the end of the encounter, patient voiced understanding.  Patient was subsequently discharged.  Final Clinical Impression(s) / ED Diagnoses Final diagnoses:  Alcoholic intoxication without complication (HCC)  Altered mental status, unspecified altered mental status type    Rx / DC Orders ED Discharge Orders     None        Holley Dexter, MD 05/30/21 Aldona Lento    Blane Ohara, MD 05/31/21 [phone]

## 2021-05-29 NOTE — ED Notes (Signed)
CT came to take pt to scans. Pt was found naked stating "I need to go home, I'm fine". Pt ripped out IV and pulled off all of her cords. RN notified. Pt not cooperating at this time to be taken over to CT.

## 2021-06-25 ENCOUNTER — Emergency Department (HOSPITAL_COMMUNITY)
Admission: EM | Admit: 2021-06-25 | Discharge: 2021-06-25 | Disposition: A | Discharge status: Home / Self Care | Payer: Medicaid Other | Attending: Emergency Medicine | Admitting: Emergency Medicine

## 2021-06-25 ENCOUNTER — Other Ambulatory Visit: Payer: Self-pay

## 2021-06-25 ENCOUNTER — Encounter (HOSPITAL_COMMUNITY): Payer: Self-pay | Admitting: Emergency Medicine

## 2021-06-25 DIAGNOSIS — F10920 Alcohol use, unspecified with intoxication, uncomplicated: Secondary | ICD-10-CM | POA: Diagnosis not present

## 2021-06-25 NOTE — ED Triage Notes (Signed)
Patient arrives via EMS. Patient was in an uber from [place] to [employer]. Upon arrival to the house, patient was unable to get out of the vehicle. Patient was initially responsive to verbal stimuli, patient is now not responsive.

## 2021-06-25 NOTE — ED Provider Notes (Signed)
Care assumed from Sharilyn Sites, PA-C. Please see providers note for full workup. Diana Goodman, this is a 45yoF who presents to ED due to alcohol intoxication. No labs performed so unsure of initial ethanol level. Vitals have been stable throughout stay. Plan to discharge when sober and able to ambulate.  Physical Exam  BP 95/69   Pulse (!) 107   Resp 16   SpO2 100%   Physical Exam Vitals and nursing note reviewed.  HENT:     Head: Normocephalic and atraumatic.  Eyes:     General: No scleral icterus. Cardiovascular:     Pulses: Normal pulses.  Pulmonary:     Effort: Pulmonary effort is normal. No respiratory distress.     Breath sounds: Normal breath sounds.  Skin:    General: Skin is warm and dry.     Findings: No rash.  Neurological:     General: No focal deficit present.     Mental Status: She is alert. She is disoriented.  Psychiatric:        Mood and Affect: Mood normal.        Behavior: Behavior normal.        Thought Content: Thought content normal.        Judgment: Judgment normal.   Please see previous providers full physical exam. ED Course/Procedures  Patient here with alcohol intoxication. Monitored overnight.  Vital signs have been stable. I reassessed the patient this morning and she is somewhat disoriented to where she is and what events occurred last night.  She is able to be reoriented. Encouraged to sit up, eat and drink, and attempt to ambulate with assistance.  We will reassess her and discharge when clinically sober.   0830: On reassessment the patient is awake and conversive.  She is tolerated p.o. intake.  She is able to ambulate.  She is unsure about the events of last night.  Clinically sober.  She is attempting to contact family or friend for pickup.  However we will discharge her as she is medically stable. Procedures  MDM  45 year old female who presents emergency department after intoxication.  Overnight she was disoriented, difficult to arouse.  She  had no episodes of vomiting, seizures overnight.  This morning she is clinically sober, able to tolerate p.o. intake, ambulating.  Vital signs are stable.  At this point she is safe for discharge.  Urged her to follow-up with primary care provider for routine medical care.  She may return for any emergent reasons.  She agrees with plan for discharge       Lenard Galloway 06/25/21 3710    Arby Barrette, MD 06/30/21 [phone]

## 2021-06-25 NOTE — ED Provider Notes (Signed)
[facility]-EMERGENCY DEPT Provider Note   CSN: [id number] Arrival date & time: 06/25/21  0043     History Chief Complaint  Patient presents with   Alcohol Intoxication    Diana Goodman is a 45 y.o. female.  The history is provided by the patient and medical records.  Alcohol Intoxication   Level 5 caveat: Intoxication  45 year old female with history of depression, ADHD, OCD, anxiety, presenting to the ED due to alcohol intoxication.  She was out with friends in [place] and got heavily intoxicated.  Friends called Benedetto Goad who drove her home to [place], however upon arrival at the house she was unable to walk and get in the house on her own.  Benedetto Goad driver called EMS who transported here.  She has not had any vomiting.  Past Medical History:  Diagnosis Date   ADHD    Anxiety    Depression    Headache    OCD (obsessive compulsive disorder)    Vitamin D deficiency     Patient Active Problem List   Diagnosis Date Noted   Suicide ideation 09/26/2018   Drug overdose, accidental or unintentional, initial encounter    Tylenol overdose 09/25/2018   Intractable episodic cluster headache 12/04/2017   Intractable chronic migraine without aura and without status migrainosus 12/04/2017   Severe episode of recurrent major depressive disorder, without psychotic features (HCC) 12/04/2017   Non compliance with medical treatment 12/04/2017    Past Surgical History:  Procedure Laterality Date   CHOLECYSTECTOMY N/A 09/12/2018   Procedure: LAPAROSCOPIC CHOLECYSTECTOMY WITH INTRAOPERATIVE CHOLANGIOGRAM ERAS PATHWAY;  Surgeon: Griselda Miner, MD;  Location: [facility] OR;  Service: General;  Laterality: N/A;     OB History   No obstetric history on file.     Family History  Problem Relation Age of Onset   Depression Mother    Anxiety disorder Mother    Migraines Mother    Depression Father    Anxiety disorder Father    Depression Brother    Anxiety disorder  Brother     Social History   Tobacco Use   Smoking status: Never   Smokeless tobacco: Never  Substance Use Topics   Alcohol use: Yes   Drug use: No    Home Medications Prior to Admission medications   Medication Sig Start Date End Date Taking? Authorizing Provider  amitriptyline (ELAVIL) 50 MG tablet Take 50 mg by mouth at bedtime. 09/10/19   [provider]  clonazePAM (KLONOPIN) 1 MG tablet Take 1 mg by mouth at bedtime as needed for sleep. 09/10/19   [provider]  norethindrone-ethinyl estradiol (LOESTRIN) 1-20 MG-MCG tablet Take 1 tablet by mouth daily. 08/24/19   [provider]  topiramate (TOPAMAX) 50 MG tablet TAKE 1 TABLET(50 MG) BY MOUTH TWICE DAILY Patient taking differently: Take 50 mg by mouth 2 (two) times daily.  06/15/19   Drema Dallas, DO    Allergies    Penicillins  Review of Systems   Review of Systems  Unable to perform ROS: Other   Physical Exam Updated Vital Signs BP 99/61   Pulse 90   Resp 16   SpO2 97%   Physical Exam Vitals and nursing note reviewed.  Constitutional:      Appearance: She is well-developed.     Comments: Drowsy appearing  HENT:     Head: Normocephalic and atraumatic.     Mouth/Throat:     Comments: Breath smells of EtOH Eyes:  Conjunctiva/sclera: Conjunctivae normal.     Pupils: Pupils are equal, round, and reactive to light.  Cardiovascular:     Rate and Rhythm: Normal rate and regular rhythm.     Heart sounds: Normal heart sounds.  Pulmonary:     Effort: Pulmonary effort is normal. No respiratory distress.     Breath sounds: Normal breath sounds. No rhonchi.  Abdominal:     General: Bowel sounds are normal.     Palpations: Abdomen is soft.  Musculoskeletal:        General: Normal range of motion.     Cervical back: Normal range of motion.  Skin:    General: Skin is warm and dry.  Neurological:     Mental Status: She is oriented to person, place, and time.    ED Results /  Procedures / Treatments   Labs (all labs ordered are listed, but only abnormal results are displayed) Labs Reviewed - No data to display  EKG None  Radiology No results found.  Procedures Procedures   Medications Ordered in ED Medications - No data to display  ED Course  I have reviewed the triage vital signs and the nursing notes.  Pertinent labs & imaging results that were available during my care of the patient were reviewed by me and considered in my medical decision making (see chart for details).    MDM Rules/Calculators/A&P                           45 year old female here acutely intoxicated.  Was drinking heavily with friends and was put into an [place].  Could not get out of the car on her own or get into the house so [employer] Goad driver called EMS and transported here.  She does appear quite intoxicated, not really responding when name is called.  She does moan to sternal rub.  Vitals are stable at present.  Will monitor.  5:42 AM Patient opens eyes to name called but still slurring her words.  Still appears intoxicated.  Will continue to monitor.  Care will be signed out to oncoming provider.  Anticipate discharge when clinically sober.  Final Clinical Impression(s) / ED Diagnoses Final diagnoses:  Alcoholic intoxication without complication [facility])    Rx / DC Orders ED Discharge Orders     None        Garlon Hatchet, PA-C 06/25/21 Ulis Rias    Geoffery Lyons, MD 06/25/21 [phone]

## 2021-06-25 NOTE — ED Notes (Signed)
D/c paperwork reviewed with pt, including f/u care. Pt with no questions or concerns at time of d/c.

## 2021-06-25 NOTE — Discharge Instructions (Addendum)
You were seen in the emergency department last night for alcohol intoxication.  While you were here we allowed you to clinically sober up.  As we discussed at the bedside we did not draw an alcohol level as this does not change our medical treatment.  The treatment for elevated ethanol levels is time and cessation of alcohol use.  This morning you are able to eat and drink.  You are also able to ambulate without assistance.  You are oriented to the events of last night and you are oriented this morning without any signs of altered mental status.  You are clinically sober now.  We have discussed you following up with your primary care provider and given instructions on alcohol intoxication and binge drinking information.  Please return to the emergency department for any reason.

## 2021-06-25 NOTE — ED Notes (Signed)
Patient heard screaming. Upon entering room, patient was curled up in a fetal position at the end of the bed sleeping. Staff repositioned patient in bed. Seizure pads placed on the bed for patient safety. Warm blankets given.

## 2021-06-25 NOTE — ED Notes (Signed)
Pt ambulate from the room to the wall and back to the room with no assist

## 2021-06-25 NOTE — ED Notes (Signed)
Patient sitting up in bed when walking by the room. Patient repositioned. Patient continually attempting to remove her gown despite multiple instructions to stop

## 2021-06-25 NOTE — ED Notes (Signed)
Pt provided [place] sandwich, orange juice.  Pt easily awoken, speaking in full sentences.

## 2023-04-15 IMAGING — DX DG CHEST 1V
1 series · 1 of 1 positions shown · non-contrast
Comparison: None.

CLINICAL DATA: Unresponsive

EXAM:
CHEST  1 VIEW

[chest ap]
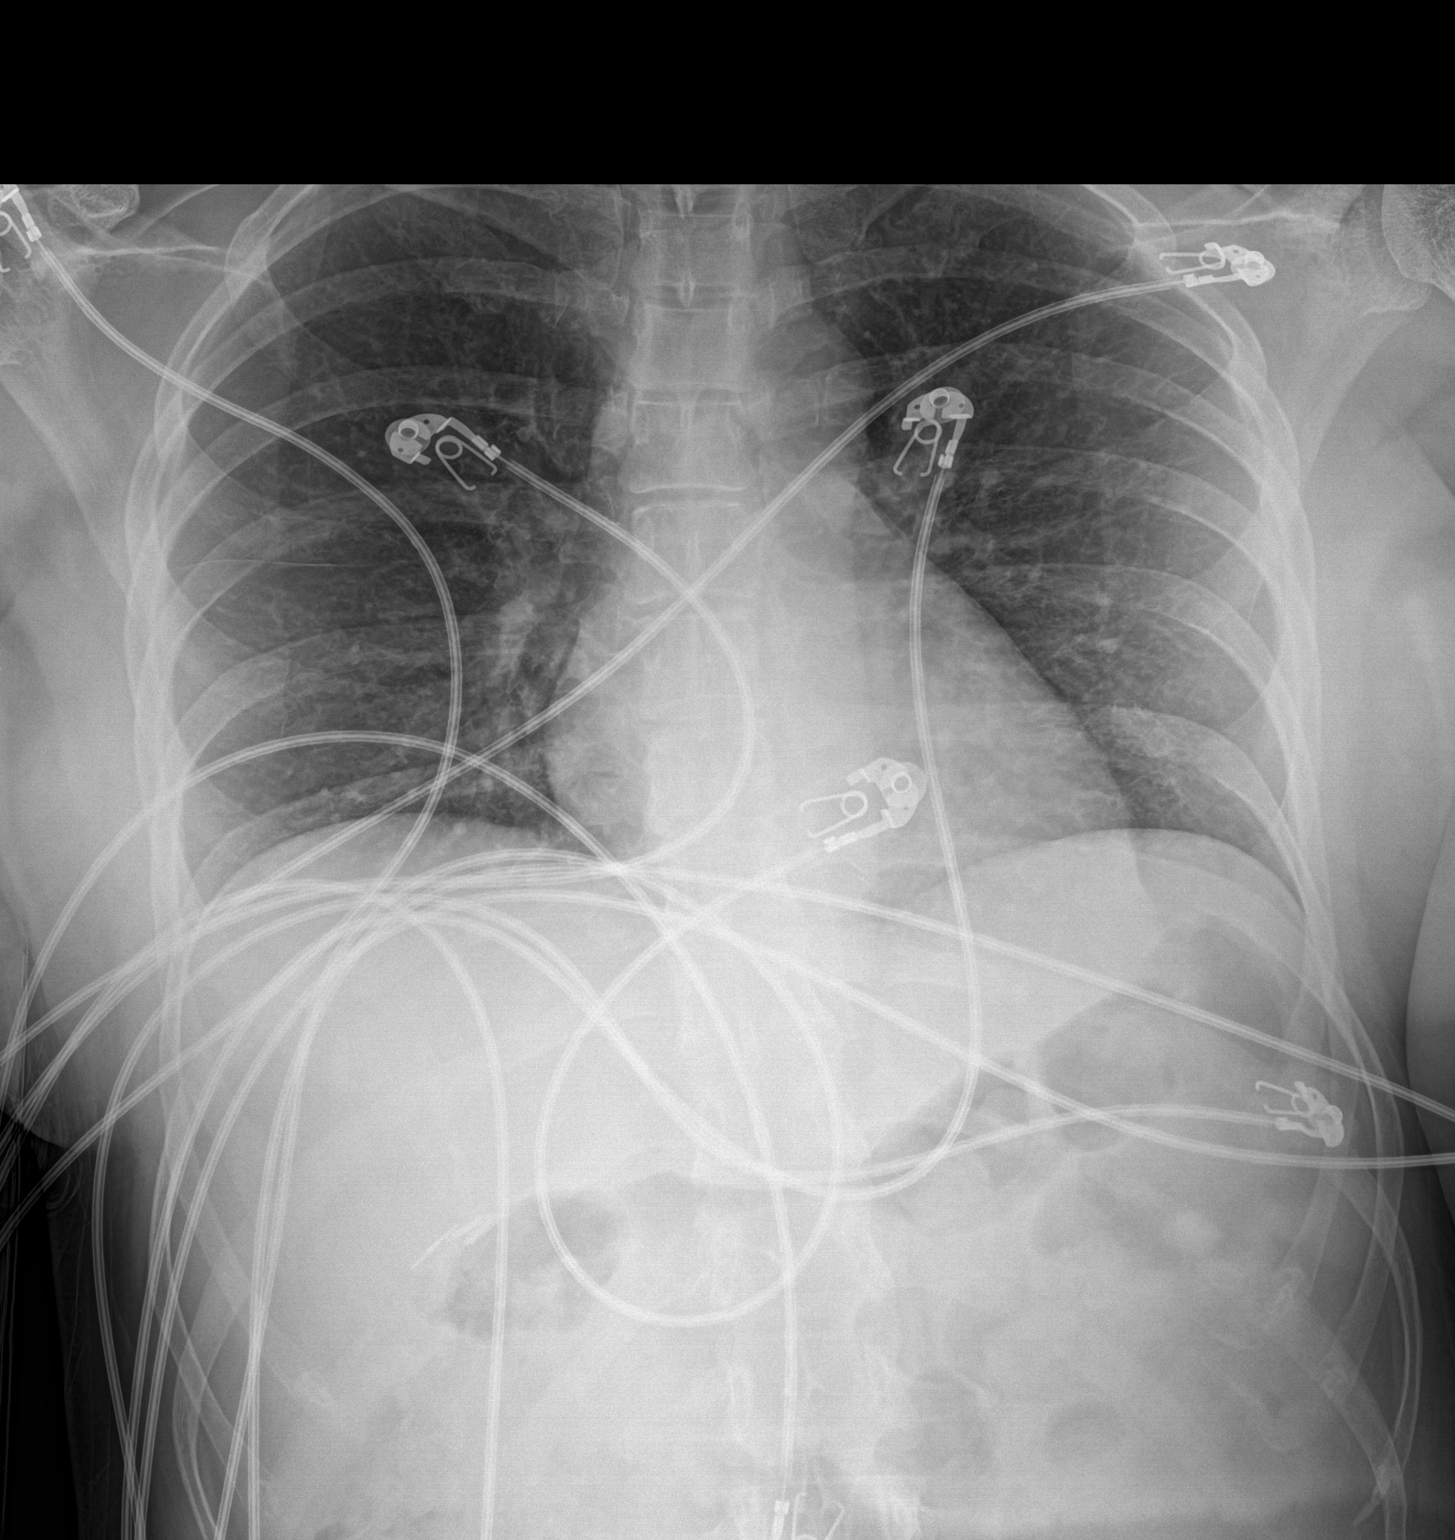

[1 of 1 positions shown; findings below may reference images not displayed]

FINDINGS: Normal mediastinum and cardiac silhouette. Normal pulmonary
vasculature. No evidence of effusion, infiltrate, or pneumothorax.
No acute bony abnormality.
IMPRESSION: No acute cardiopulmonary process.

## 2023-06-05 ENCOUNTER — Ambulatory Visit: Payer: Medicaid Other | Admitting: Family Medicine

## 2023-06-06 ENCOUNTER — Ambulatory Visit (INDEPENDENT_AMBULATORY_CARE_PROVIDER_SITE_OTHER): Payer: Medicaid Other | Admitting: Family Medicine

## 2023-06-06 ENCOUNTER — Encounter: Payer: Self-pay | Admitting: Family Medicine

## 2023-06-06 VITALS — BP 107/72 | HR 100 | Temp 98.3°F | Resp 18 | Ht 64.0 in | Wt 150.7 lb

## 2023-06-06 DIAGNOSIS — F411 Generalized anxiety disorder: Secondary | ICD-10-CM | POA: Insufficient documentation

## 2023-06-06 DIAGNOSIS — F32A Depression, unspecified: Secondary | ICD-10-CM

## 2023-06-06 DIAGNOSIS — R519 Headache, unspecified: Secondary | ICD-10-CM

## 2023-06-06 DIAGNOSIS — Z7689 Persons encountering health services in other specified circumstances: Secondary | ICD-10-CM | POA: Insufficient documentation

## 2023-06-06 DIAGNOSIS — F419 Anxiety disorder, unspecified: Secondary | ICD-10-CM

## 2023-06-06 MED ORDER — BUSPIRONE HCL 5 MG PO TABS: 5.0000 mg | ORAL_TABLET | Freq: Two times a day (BID) | ORAL | 0 refills | Status: DC

## 2023-06-06 NOTE — Assessment & Plan Note (Signed)
Recommend [facility] and psychiatry for medication management. Address provided.

## 2023-06-06 NOTE — Assessment & Plan Note (Signed)
Ongoing depression and anxiety since death of husband 10 years ago. She is a single parent which is stressful. Taking Pristiq which is helping her.  PDMP 8/16 # 28 lorazepam filled. 7/30 # 60 clonazepam filled. Reports that valium works, she is not taking this now.  Explained that adding more medication to current regimen is not safe prescribing. She needs psychiatry input. She is [facility] resident, address for [facility] provided.

## 2023-06-06 NOTE — Patient Instructions (Addendum)
[facility] [address]  [place], [place]  No appointment necessary  Open 24/7.    [phone]

## 2023-06-06 NOTE — Assessment & Plan Note (Signed)
Reports current regimen is not working to reduce her headaches. Prescribed Emgality 120 mg and Floricet which is not working. She reports that Elavil works for her. Explained that there is drug interactions with the medications she is on and it is not safe to prescribe Elavil today.
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
## 2024-03-03 ENCOUNTER — Other Ambulatory Visit: Payer: Self-pay | Admitting: Obstetrics and Gynecology

## 2024-03-03 DIAGNOSIS — N6310 Unspecified lump in the right breast, unspecified quadrant: Secondary | ICD-10-CM

## 2024-03-28 ENCOUNTER — Encounter (HOSPITAL_COMMUNITY): Payer: Self-pay | Admitting: *Deleted

## 2024-03-28 ENCOUNTER — Emergency Department (HOSPITAL_COMMUNITY)
Admission: EM | Admit: 2024-03-28 | Discharge: 2024-03-28 | Discharge status: Against Medical Advice | Attending: Emergency Medicine | Admitting: Emergency Medicine

## 2024-03-28 ENCOUNTER — Other Ambulatory Visit: Payer: Self-pay

## 2024-03-28 DIAGNOSIS — R350 Frequency of micturition: Secondary | ICD-10-CM | POA: Insufficient documentation

## 2024-03-28 DIAGNOSIS — M546 Pain in thoracic spine: Secondary | ICD-10-CM | POA: Diagnosis present

## 2024-03-28 DIAGNOSIS — Z5321 Procedure and treatment not carried out due to patient leaving prior to being seen by health care provider: Secondary | ICD-10-CM | POA: Diagnosis not present

## 2024-03-28 HISTORY — DX: Interstitial cystitis (chronic) without hematuria: N30.10

## 2024-03-28 LAB — COMPREHENSIVE METABOLIC PANEL WITH GFR
ALT: 12 U/L (ref 0–44)
AST: 16 U/L (ref 15–41)
Albumin: 4.1 g/dL (ref 3.5–5.0)
Alkaline Phosphatase: 51 U/L (ref 38–126)
Anion gap: 10 (ref 5–15)
BUN: 17 mg/dL (ref 6–20)
CO2: 26 mmol/L (ref 22–32)
Calcium: 9.9 mg/dL (ref 8.9–10.3)
Chloride: 105 mmol/L (ref 98–111)
Creatinine, Ser: 0.99 mg/dL (ref 0.44–1.00)
GFR, Estimated: >60 mL/min (ref >60)
Glucose, Bld: 98 mg/dL (ref 70–99)
Potassium: 4.3 mmol/L (ref 3.5–5.1)
Sodium: 141 mmol/L (ref 135–145)
Total Bilirubin: 0.8 mg/dL (ref 0.0–1.2)
Total Protein: 7.2 g/dL (ref 6.5–8.1)

## 2024-03-28 LAB — CBC
HCT: 43.4 % (ref 36.0–46.0)
Hemoglobin: 14.8 g/dL (ref 12.0–15.0)
MCH: 31.1 pg (ref 26.0–34.0)
MCHC: 34.1 g/dL (ref 30.0–36.0)
MCV: 91.2 fL (ref 80.0–100.0)
Platelets: 248 10*3/uL (ref 150–400)
RBC: 4.76 MIL/uL (ref 3.87–5.11)
RDW: 11.4 % — ABNORMAL LOW (ref 11.5–15.5)
WBC: 4.8 10*3/uL (ref 4.0–10.5)
nRBC: 0 % (ref 0.0–0.2)

## 2024-03-28 NOTE — ED Provider Triage Note (Signed)
 Emergency Medicine Provider Triage Evaluation Note  Diana Goodman , a 48 y.o. female  was evaluated in triage.  Pt complains of right upper back pain, urinary frequency. States that her urinary symptoms have been ongoing for some time, has been checked for UTI previously with negative results.  This morning woke up with right upper back pain, thought she might have a kidney stone.  Endorses history of kidney stones last time was 17 years ago and is unsure if her pain was similar.  Denies chest pain or shortness of breath.  No nausea, vomiting, or diarrhea.  No dysuria.  She notes that her back pain is worse with movement, specifically when she turns her neck or moves her arm.  Review of Systems  Positive:  Negative:   Physical Exam  BP 121/80 (BP Location: Right Arm)   Pulse 94   Temp 98.2 F (36.8 C) (Oral)   Resp 16   Ht 5' 4 (1.626 m)   Wt 68.4 kg   SpO2 100%   BMI 25.88 kg/m  Gen:   Awake, no distress   Resp:  Normal effort  MSK:   Moves extremities without difficulty  Other:  Pain in right upper back area. Not reproducible to direct palpation, but worse with movement. No CVA tenderness. Abdomen soft and nontender  Medical Decision Making  Medically screening exam initiated at 3:43 PM.  Appropriate orders placed.  CORI JUSTUS was informed that the remainder of the evaluation will be completed by another provider, this initial triage assessment does not replace that evaluation, and the importance of remaining in the ED until their evaluation is complete.     Nora Lauraine LABOR, PA-C 03/28/24 1545

## 2024-03-28 NOTE — ED Notes (Signed)
 Patient still not in room. EDP aware.

## 2024-03-28 NOTE — ED Triage Notes (Signed)
 The pt has ic she has had difficulty voiding for 2 months   she has been hurting in her  rt posterior chest for one week  no temp  lmp bc

## 2024-03-28 NOTE — ED Notes (Signed)
 Patient not in room. EDP aware.

## 2024-03-28 NOTE — ED Provider Notes (Signed)
 Went in room to assess patient, she was not in room so I was unable to assess her. Checked bathroom and did not see her there. Alerted nursing to ensure that if she returned to alert me.  According to EDP in triage, patient presented to Emergency Department for evaluation of right upper back pain, urinary frequency that started today was previously checked for UTI with negative results.  Back pain is worse with movement.    CMP and CBC unremarkable with no leukocytosis nor anemia nor severe electrolyte abnormality nor AKI.  Did not provide sample for hCG nor UA.  Did not obtain chest x-ray prior to eloping from Emergency Department.   Minnie Tinnie BRAVO, PA 03/28/24 1851    Patsey Lot, MD 03/28/24 [phone]

## 2024-04-07 ENCOUNTER — Other Ambulatory Visit: Payer: Self-pay | Admitting: Obstetrics and Gynecology

## 2024-04-07 ENCOUNTER — Ambulatory Visit
Admission: RE | Admit: 2024-04-07 | Discharge: 2024-04-07 | Disposition: A | Discharge status: Home / Self Care | Payer: MEDICAID | Source: Ambulatory Visit | Attending: Obstetrics and Gynecology | Admitting: Obstetrics and Gynecology

## 2024-04-07 ENCOUNTER — Ambulatory Visit
Admission: RE | Admit: 2024-04-07 | Discharge: 2024-04-07 | Disposition: A | Discharge status: Home / Self Care | Source: Ambulatory Visit | Attending: Obstetrics and Gynecology | Admitting: Obstetrics and Gynecology

## 2024-04-07 DIAGNOSIS — N6310 Unspecified lump in the right breast, unspecified quadrant: Secondary | ICD-10-CM

## 2024-04-13 ENCOUNTER — Other Ambulatory Visit

## 2024-04-13 ENCOUNTER — Encounter

## 2024-05-26 ENCOUNTER — Other Ambulatory Visit: Payer: Self-pay

## 2024-05-26 ENCOUNTER — Emergency Department (HOSPITAL_COMMUNITY)
Admission: EM | Admit: 2024-05-26 | Discharge: 2024-05-27 | Disposition: A | Discharge status: Other Acute Inpatient Hospital | Payer: MEDICAID | Attending: Emergency Medicine | Admitting: Emergency Medicine

## 2024-05-26 ENCOUNTER — Encounter (HOSPITAL_COMMUNITY): Payer: Self-pay

## 2024-05-26 DIAGNOSIS — F411 Generalized anxiety disorder: Secondary | ICD-10-CM | POA: Diagnosis not present

## 2024-05-26 DIAGNOSIS — T50902A Poisoning by unspecified drugs, medicaments and biological substances, intentional self-harm, initial encounter: Secondary | ICD-10-CM | POA: Diagnosis present

## 2024-05-26 DIAGNOSIS — X838XXA Intentional self-harm by other specified means, initial encounter: Secondary | ICD-10-CM | POA: Diagnosis not present

## 2024-05-26 DIAGNOSIS — T424X2A Poisoning by benzodiazepines, intentional self-harm, initial encounter: Secondary | ICD-10-CM | POA: Insufficient documentation

## 2024-05-26 DIAGNOSIS — F29 Unspecified psychosis not due to a substance or known physiological condition: Secondary | ICD-10-CM | POA: Diagnosis not present

## 2024-05-26 LAB — COMPREHENSIVE METABOLIC PANEL WITH GFR
ALT: 14 U/L (ref 0–44)
AST: 17 U/L (ref 15–41)
Albumin: 4.2 g/dL (ref 3.5–5.0)
Alkaline Phosphatase: 72 U/L (ref 38–126)
Anion gap: 13 (ref 5–15)
BUN: 17 mg/dL (ref 6–20)
CO2: 24 mmol/L (ref 22–32)
Calcium: 9.5 mg/dL (ref 8.9–10.3)
Chloride: 104 mmol/L (ref 98–111)
Creatinine, Ser: 0.86 mg/dL (ref 0.44–1.00)
GFR, Estimated: >60 mL/min (ref >60)
Glucose, Bld: 86 mg/dL (ref 70–99)
Potassium: 4.1 mmol/L (ref 3.5–5.1)
Sodium: 141 mmol/L (ref 135–145)
Total Bilirubin: 0.4 mg/dL (ref 0.0–1.2)
Total Protein: 6.6 g/dL (ref 6.5–8.1)

## 2024-05-26 LAB — CBC WITH DIFFERENTIAL/PLATELET
Abs Immature Granulocytes: 0.02 K/uL (ref 0.00–0.07)
Basophils Absolute: 0 K/uL (ref 0.0–0.1)
Basophils Relative: 0 %
Eosinophils Absolute: 0.1 K/uL (ref 0.0–0.5)
Eosinophils Relative: 1 %
HCT: 41.4 % (ref 36.0–46.0)
Hemoglobin: 13.8 g/dL (ref 12.0–15.0)
Immature Granulocytes: 0 %
Lymphocytes Relative: 39 %
Lymphs Abs: 2.1 K/uL (ref 0.7–4.0)
MCH: 30.3 pg (ref 26.0–34.0)
MCHC: 33.3 g/dL (ref 30.0–36.0)
MCV: 91 fL (ref 80.0–100.0)
Monocytes Absolute: 0.5 K/uL (ref 0.1–1.0)
Monocytes Relative: 9 %
Neutro Abs: 2.6 K/uL (ref 1.7–7.7)
Neutrophils Relative %: 51 %
Platelets: 278 K/uL (ref 150–400)
RBC: 4.55 MIL/uL (ref 3.87–5.11)
RDW: 12.3 % (ref 11.5–15.5)
WBC: 5.2 K/uL (ref 4.0–10.5)
nRBC: 0 % (ref 0.0–0.2)

## 2024-05-26 LAB — HCG, SERUM, QUALITATIVE: Preg, Serum: NEGATIVE

## 2024-05-26 LAB — URINE DRUG SCREEN
Amphetamines: NEGATIVE
Barbiturates: NEGATIVE
Benzodiazepines: POSITIVE — AB
Cocaine: NEGATIVE
Fentanyl: NEGATIVE
Methadone Scn, Ur: NEGATIVE
Opiates: NEGATIVE
Tetrahydrocannabinol: NEGATIVE

## 2024-05-26 LAB — ETHANOL: Alcohol, Ethyl (B): <15 mg/dL (ref <15)

## 2024-05-26 LAB — SALICYLATE LEVEL: Salicylate Lvl: <7 mg/dL — ABNORMAL LOW (ref 7.0–30.0)

## 2024-05-26 LAB — ACETAMINOPHEN LEVEL
Acetaminophen (Tylenol), Serum: <10 ug/mL — ABNORMAL LOW (ref 10–30)
Acetaminophen (Tylenol), Serum: <10 ug/mL — ABNORMAL LOW (ref 10–30)

## 2024-05-26 MED ORDER — ACETAMINOPHEN 325 MG PO TABS: 650.0000 mg | ORAL_TABLET | ORAL | Status: DC | PRN

## 2024-05-26 MED ORDER — ZIPRASIDONE MESYLATE 20 MG IM SOLR
20.0000 mg | Freq: Once | INTRAMUSCULAR | Status: AC
  Administered 2024-05-26: 20 mg via INTRAMUSCULAR
  Filled 2024-05-26: qty 20

## 2024-05-26 MED ORDER — ZIPRASIDONE MESYLATE 20 MG IM SOLR: 10.0000 mg | Freq: Four times a day (QID) | INTRAMUSCULAR | Status: DC | PRN

## 2024-05-26 MED ORDER — ONDANSETRON HCL 4 MG PO TABS: 4.0000 mg | ORAL_TABLET | Freq: Three times a day (TID) | ORAL | Status: DC | PRN

## 2024-05-26 MED ORDER — QUETIAPINE FUMARATE 25 MG PO TABS
25.0000 mg | ORAL_TABLET | ORAL | Status: DC | PRN
  Administered 2024-05-27: 25 mg via ORAL
  Filled 2024-05-26: qty 1

## 2024-05-26 MED ORDER — ALUM & MAG HYDROXIDE-SIMETH 200-200-20 MG/5ML PO SUSP: 30.0000 mL | Freq: Four times a day (QID) | ORAL | Status: DC | PRN

## 2024-05-26 MED ORDER — DIAZEPAM 5 MG/ML IJ SOLN: 10.0000 mg | Freq: Four times a day (QID) | INTRAMUSCULAR | Status: DC | PRN

## 2024-05-26 MED ORDER — DIPHENHYDRAMINE HCL 50 MG/ML IJ SOLN
50.0000 mg | Freq: Once | INTRAMUSCULAR | Status: AC
  Administered 2024-05-26: 50 mg via INTRAMUSCULAR
  Filled 2024-05-26: qty 1

## 2024-05-26 NOTE — Consult Note (Signed)
 [facility] Consult Note  Patient Name: Diana Goodman MRN: [id number] DOB: Jan 08, 1976 DATE OF Consult: 05/26/2024  PRIMARY PSYCHIATRIC DIAGNOSES   1.  Generalized Anxiety Disorder 2.  R/O Mood Disorder vs. Recurrent Depression   RECOMMENDATIONS  Recommendations: Medication recommendations: IN THE ED:  Patient states that she brought a supply of her Pristiq , 50 mg every day for depression/anxiety.  If she can take that medication while in the ED, awaiting placement, then I recommend that you do that.  HOWEVER, if that is not possible, I do not recommend substituting for the Pristiq  (e.g., with venlafaxine ) in the ED. She has only been on the med for a short time, and she would likely not have withdrawal sx's.  FOR ANXIETY (moderate):  Try Seroquel  25 mg q4h PRN.  For severe agitation/aggressions:  Geodon  10 mg IM q6h PRN and Valium  10 mg IM q6h PRN Non-Medication/therapeutic recommendations: Given patient's erratic behavior and anxiety/depression, she should continue on close observation, per ED protocol, until she can be safely admitted to Psychiatry.  Continue with matter-of-fact emotional support in ED, pending transfer Is inpatient psychiatric hospitalization recommended for this patient? Yes (Explain why): Patient purposely took 20 Valium  tablets.  She claims that this was not a suicide attempt, but she clearly stated that it was a desperate attempt to get her anxiety to stop, showing that she is not using good judgment in her self-care, which can lead to severe, life-threatening harm, all of which is caused by her depression and anxiety.  Given this, she meets IVC criteria and should be admitted under such criteria. Is another care setting recommended for this patient? (examples may include Crisis Stabilization Unit, Residential/Recovery Treatment, ALF/SNF, Memory Care Unit)  No (Explain why): See above From a psychiatric perspective, is this patient appropriate for discharge to an  outpatient setting/resource or other less restrictive environment for continued care?  No (Explain why): See above Follow-Up Telepsychiatry C/L services: We will sign off for now. Please re-consult our service if needed for any concerning changes in the patient's condition, discharge planning, or questions. Communication: Treatment team members (and family members if applicable) who were involved in treatment/care discussions and planning, and with whom we spoke or engaged with via secure text/chat, include the following: Discussed case with Dr. Darra, ED provider, who concurred with recommendations  Thank you for involving us  in the care of this patient. If you have any additional questions or concerns, please call [phone] and ask for the provider on-call.   ATTESTATION   As the provider for this telehealth consult, I attest that I verified the patient's identity using two separate identifiers, introduced myself to the patient, provided my credentials, disclosed my location, and performed this encounter via a HIPAA-compliant, real-time, face-to-face, two-way, interactive audio and video platform and with the full consent and agreement of the patient (or guardian as applicable.)   Patient physical location: [facility]. Telehealth provider physical location: home office in state of [place] .  Video start time: 2230h EDT Video end time: 2245h EDT   Total time spent in this encounter was 30 minutes, including record review, clinical interview, behavior observations, discussion of impressions and recommendations (including medications and hospitalization), and consultation/communication with relevant parties   IDENTIFYING DATA  Diana Goodman is a 48 y.o. year-old female for whom a psychiatric consultation has been ordered by the primary provider. The patient was identified using two separate identifiers.  CHIEF COMPLAINT/REASON FOR CONSULT   I knew I shouldn't take all that  Valium , but I  wanted the anxiety to stop.  I knew all that Valium  would make me feel worse.   HISTORY OF PRESENT ILLNESS (HPI)  The patient that she has been struggling with depression and anxiety for the past fifteen years or so, and that at one point she regularly took Klonopin .  She was able to get off the Klonopin  and remain off until the death of her husband on June 13, 2014, at which point she began taking it again.  She states that she took 1 mg tid until about eight months ago, when she stopped it on her own.  She reports a lot of withdrawal sx's and rebound anxiety, but claims that she did not take anything until late May, at which point she was given Valium  5 mg tid.  Pristiq  50 mg every day was added for depression/anxiety earlier this month by her provider, a Dr. Claudene at an Urgent Care (?).  Patient claims that she had not been overtaking the Valium , and had even not used it some days, but for some reason that she cannot explain, tonight she decided that her anxiety was so bad, she needed to take 20 5-mg Valiums at once.  She claims that this was not a suicide attempt, but also states that she knew that she would feel worse if she took too much of the Valium --and she cannot explain why otherwise she would have taken so much of the medication.  Per staff, there are reports that patient did indeed make suicidal statements to her mother.  Patient became quite agitated and violent in the ED when told that she would need to stay to be monitored, and she required Geodon  and Benadryl  and, at one point, restraints.  She is calmer now, but remains impulsive and unable so far to think through the consequences of her actions.  No psychotic sx's.     PAST PSYCHIATRIC HISTORY   Otherwise as per HPI above.  PAST MEDICAL HISTORY  Past Medical History:  Diagnosis Date   ADHD    Anxiety    Depression    GAD (generalized anxiety disorder)    Headache    IC (interstitial cystitis)    OCD (obsessive compulsive disorder)     Vitamin D deficiency      HOME MEDICATIONS  Facility Ordered Medications  Medication   [COMPLETED] ziprasidone  (GEODON ) injection 20 mg   [COMPLETED] diphenhydrAMINE  (BENADRYL ) injection 50 mg   ondansetron  (ZOFRAN ) tablet 4 mg   alum & mag hydroxide-simeth (MAALOX/MYLANTA) 200-200-20 MG/5ML suspension 30 mL   acetaminophen  (TYLENOL ) tablet 650 mg   QUEtiapine  (SEROQUEL ) tablet 25 mg   ziprasidone  (GEODON ) injection 10 mg   diazepam  (VALIUM ) injection 10 mg   PTA Medications  Medication Sig   desvenlafaxine  (PRISTIQ ) 50 MG 24 hr tablet Take 50 mg by mouth daily.   topiramate  (TOPAMAX ) 50 MG tablet TAKE 1 TABLET(50 MG) BY MOUTH TWICE DAILY (Patient not taking: Reported on 05/26/2024)   amitriptyline  (ELAVIL ) 50 MG tablet Take 50 mg by mouth at bedtime. (Patient not taking: Reported on 05/26/2024)   clonazePAM  (KLONOPIN ) 1 MG tablet Take 1 mg by mouth at bedtime as needed for sleep. (Patient not taking: Reported on 05/26/2024)     ALLERGIES  Allergies  Allergen Reactions   Benadryl  [Diphenhydramine ] Other (See Comments)    -restless leg   Penicillins Nausea And Vomiting    Has patient had a PCN reaction causing immediate rash, facial/tongue/throat swelling, SOB or lightheadedness with hypotension: No Has patient  had a PCN reaction causing severe rash involving mucus membranes or skin necrosis: No Has patient had a PCN reaction that required hospitalization: No Has patient had a PCN reaction occurring within the last 10 years: Unknown If all of the above answers are NO, then may proceed with Cephalosporin use.     SOCIAL & SUBSTANCE USE HISTORY  Social History   Socioeconomic History   Marital status: Widowed    Spouse name: Not on file   Number of children: 2   Years of education: Not on file   Highest education level: Some college, no degree  Occupational History   Not on file  Tobacco Use   Smoking status: Never   Smokeless tobacco: Never  Substance and Sexual  Activity   Alcohol use: Yes   Drug use: No   Sexual activity: Not on file  Other Topics Concern   Not on file  Social History Narrative   Patient is right-handed. She rarely drinks caffeine. She does not regularly exercise.   Social Drivers of Corporate investment banker Strain: Not on file  Food Insecurity: Low Risk  (03/11/2024)   Received from [facility]   Hunger Vital Sign    Within the past 12 months, you worried that your food would run out before you got money to buy more: Never true    Within the past 12 months, the food you bought just didn't last and you didn't have money to get more. : Never true  Transportation Needs: No Transportation Needs (03/11/2024)   Received from [facility]    In the past 12 months, has lack of reliable transportation kept you from medical appointments, meetings, work or from getting things needed for daily living? : No  Physical Activity: Not on file  Stress: Not on file  Social Connections: Not on file   Social History   Tobacco Use  Smoking Status Never  Smokeless Tobacco Never   Social History   Substance and Sexual Activity  Alcohol Use Yes   Social History   Substance and Sexual Activity  Drug Use No    Additional pertinent information Has two children, who are currently being cared for by her mother.  FAMILY HISTORY  Family History  Problem Relation Age of Onset   Depression Mother    Anxiety disorder Mother    Migraines Mother    Depression Father    Anxiety disorder Father    Breast cancer Paternal Aunt    Depression Brother    Anxiety disorder Brother    Family Psychiatric History (if known):  As above  MENTAL STATUS EXAM (MSE)  Mental Status Exam: General Appearance: Fairly Groomed  Orientation:  Full (Time, Place, and Person)  Memory:  Immediate;   Fair Recent;   Fair Remote;   Fair  Concentration:  Concentration: Fair and Attention Span: Fair  Recall:  Fair  Attention  Fair  Eye  Contact:  Minimal  Speech:  Slow  Language:  Good  Volume:  Decreased  Mood: I'm just so anxious  Affect:  Slightly agitated  Thought Process:  Descriptions of Associations: Circumstantial  Thought Content:  Rumination and constant talk of anxiety  Suicidal Thoughts:  Denies suicidal intent, but admits overdosed on the Valium  intentionally and knew that it was dangerous to do so.  Homicidal Thoughts:  No  Judgement:  Poor  Insight:  Lacking  Psychomotor Activity:  Decreased  Akathisia:  Negative  Fund of Knowledge:  Fair  Assets:  Manufacturing systems engineer Social Support  Cognition:  WNL  ADL's:  Intact  AIMS (if indicated):       VITALS  Blood pressure 112/83, pulse 79, temperature 98.5 F (36.9 C), resp. rate 16, SpO2 100%.  LABS  Admission on 05/26/2024  Component Date Value Ref Range Status   Sodium 05/26/2024 141  135 - 145 mmol/L Final   Potassium 05/26/2024 4.1  3.5 - 5.1 mmol/L Final   Chloride 05/26/2024 104  98 - 111 mmol/L Final   CO2 05/26/2024 24  22 - 32 mmol/L Final   Glucose, Bld 05/26/2024 86  70 - 99 mg/dL Final   Glucose reference range applies only to samples taken after fasting for at least 8 hours.   BUN 05/26/2024 17  6 - 20 mg/dL Final   Creatinine, Ser 05/26/2024 0.86  0.44 - 1.00 mg/dL Final   Calcium 91/73/7974 9.5  8.9 - 10.3 mg/dL Final   Total Protein 91/73/7974 6.6  6.5 - 8.1 g/dL Final   Albumin 91/73/7974 4.2  3.5 - 5.0 g/dL Final   AST 91/73/7974 17  15 - 41 U/L Final   ALT 05/26/2024 14  0 - 44 U/L Final   Alkaline Phosphatase 05/26/2024 72  38 - 126 U/L Final   Total Bilirubin 05/26/2024 0.4  0.0 - 1.2 mg/dL Final   GFR, Estimated 05/26/2024 >60  >60 mL/min Final   Comment: (NOTE) Calculated using the CKD-EPI Creatinine Equation (2021)    Anion gap 05/26/2024 13  5 - 15 Final   Performed at [facility], [address], [place], [place] [address]   Acetaminophen  (Tylenol ), Serum 05/26/2024 <10 (L)  10 - 30 ug/mL  Final   Comment: (NOTE) Toxic concentrations can be more effectively related to post dose interval; > 200, > 100, and > 50 ug/mL serum concentrations correspond to toxic concentrations at 4, 8, and 12 hours post dose, respectively.  Performed at [facility], [address] [place], [place] [address]    Alcohol, Ethyl (B) 05/26/2024 <15  <15 mg/dL Final   Comment: (NOTE) For medical purposes only. Performed at [facility], [address] [place], [place] [address]    Salicylate Lvl 05/26/2024 <7.0 (L)  7.0 - 30.0 mg/dL Final   Performed at [facility], [address], [place], [place] [address]   WBC 05/26/2024 5.2  4.0 - 10.5 K/uL Final   RBC 05/26/2024 4.55  3.87 - 5.11 MIL/uL Final   Hemoglobin 05/26/2024 13.8  12.0 - 15.0 g/dL Final   HCT 91/73/7974 41.4  36.0 - 46.0 % Final   MCV 05/26/2024 91.0  80.0 - 100.0 fL Final   MCH 05/26/2024 30.3  26.0 - 34.0 pg Final   MCHC 05/26/2024 33.3  30.0 - 36.0 g/dL Final   RDW 91/73/7974 12.3  11.5 - 15.5 % Final   Platelets 05/26/2024 278  150 - 400 K/uL Final   nRBC 05/26/2024 0.0  0.0 - 0.2 % Final   Neutrophils Relative % 05/26/2024 51  % Final   Neutro Abs 05/26/2024 2.6  1.7 - 7.7 K/uL Final   Lymphocytes Relative 05/26/2024 39  % Final   Lymphs Abs 05/26/2024 2.1  0.7 - 4.0 K/uL Final   Monocytes Relative 05/26/2024 9  % Final   Monocytes Absolute 05/26/2024 0.5  0.1 - 1.0 K/uL Final   Eosinophils Relative 05/26/2024 1  % Final   Eosinophils Absolute 05/26/2024 0.1  0.0 - 0.5  K/uL Final   Basophils Relative 05/26/2024 0  % Final   Basophils Absolute 05/26/2024 0.0  0.0 - 0.1 K/uL Final   Immature Granulocytes 05/26/2024 0  % Final   Abs Immature Granulocytes 05/26/2024 0.02  0.00 - 0.07 K/uL Final   Performed at [facility], [address], [place], [place] [address]   Opiates 05/26/2024 NEGATIVE  NEGATIVE Final   Cocaine 05/26/2024 NEGATIVE   NEGATIVE Final   Benzodiazepines 05/26/2024 POSITIVE (A)  NEGATIVE Final   Amphetamines 05/26/2024 NEGATIVE  NEGATIVE Final   Tetrahydrocannabinol 05/26/2024 NEGATIVE  NEGATIVE Final   Barbiturates 05/26/2024 NEGATIVE  NEGATIVE Final   Methadone Scn, Ur 05/26/2024 NEGATIVE  NEGATIVE Final   Fentanyl  05/26/2024 NEGATIVE  NEGATIVE Final   Comment: (NOTE) Drug screen is for Medical Purposes only. Positive results are preliminary only. If confirmation is needed, notify lab within 5 days.  Drug Class                 Cutoff (ng/mL) Amphetamine and metabolites 1000 Barbiturate and metabolites 200 Benzodiazepine              200 Opiates and metabolites     300 Cocaine and metabolites     300 THC                         50 Fentanyl                     5 Methadone                   300  Trazodone  is metabolized in vivo to several metabolites,  including pharmacologically active m-CPP, which is excreted in the  urine.  Immunoassay screens for amphetamines and MDMA have potential  cross-reactivity with these compounds and may provide false positive  result.  Performed at [facility], [address] [place], [place] [address]    Preg, Serum 05/26/2024 NEGATIVE  NEGATIVE Final   Comment:        THE SENSITIVITY OF THIS METHODOLOGY IS >10 mIU/mL. Performed at [facility], [address] [place], [place] [address]    Acetaminophen  (Tylenol ), Serum 05/26/2024 <10 (L)  10 - 30 ug/mL Final   Comment: (NOTE) Toxic concentrations can be more effectively related to post dose interval; > 200, > 100, and > 50 ug/mL serum concentrations correspond to toxic concentrations at 4, 8, and 12 hours post dose, respectively.  Performed at [facility], [address] [place], [place] [address]     PSYCHIATRIC REVIEW OF SYSTEMS (ROS)  ROS: Notable for the following relevant positive findings: Review of Systems  Constitutional: Negative.    HENT: Negative.    Eyes: Negative.   Respiratory: Negative.    Cardiovascular: Negative.   Gastrointestinal: Negative.   Genitourinary: Negative.   Musculoskeletal: Negative.   Skin: Negative.   Neurological: Negative.   Endo/Heme/Allergies: Negative.   Psychiatric/Behavioral:  Positive for depression and suicidal ideas. The patient is nervous/anxious.     Additional findings:      Musculoskeletal: No abnormal movements observed      Gait & Station: Laying/Sitting      Pain Screening: Denies      Nutrition & Dental Concerns: Reviewed   RISK FORMULATION/ASSESSMENT  Is the patient experiencing any suicidal or homicidal ideations: Yes       Explain if yes:   While patient states that she was  not suicidal, she also states that she purposely overdosed even though she knew that it was dangerous to do, showing that she continue to lack the understanding of the dangerousness of her illness and her actions.  Protective factors considered for safety management:   Patient's family was not able to provide the structure sufficient to keep her from overdosing.  Risk factors/concerns considered for safety management:  Depression Substance abuse/dependence Access to lethal means Hopelessness Impulsivity Isolation Barriers to accessing treatment Unmarried  Is there a safety management plan with the patient and treatment team to minimize risk factors and promote protective factors: No           Explain:   As above, patient intentionally overdosed, even with the support of her family available. Is crisis care placement or psychiatric hospitalization recommended: Yes     Based on my current evaluation and risk assessment, patient is determined at this time to be at:  High risk  *RISK ASSESSMENT Risk assessment is a dynamic process; it is possible that this patient's condition, and risk level, may change. This should be re-evaluated and managed over time as appropriate. Please  re-consult psychiatric consult services if additional assistance is needed in terms of risk assessment and management. If your team decides to discharge this patient, please advise the patient how to best access emergency psychiatric services, or to call 911, if their condition worsens or they feel unsafe in any way.   Adriana JINNY Pontes, MD Telepsychiatry Consult Services
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
## 2024-05-26 NOTE — ED Notes (Signed)
 Pt changed out in to scrubs, one belongings bag including purse, med bottles, phone, 2 shirts, pants placed in cabinet labelled 9-12

## 2024-05-26 NOTE — ED Triage Notes (Addendum)
 BIBA with BHRT from home for threatening suicide- reportedly sent texts to mother. Pt denies wanting to self harm. Per mother, pt took 20 5mg  diazepam - Rx was filled on the 13th, has 34 left. Unable to determine if she truly took this many at once. Hx of depression, anxiety. Pt states she needs help to get off of benzodiazepines, and needs help with anxiety. 69 cbg 100% r/a  92 hr 122/78 bp

## 2024-05-26 NOTE — ED Notes (Signed)
 Pt has 2 medication bottles (Diazepam , Desvenlafaxine ) secured in the pharmacy. Receipt # [id number]

## 2024-05-26 NOTE — ED Notes (Signed)
 Chart reviewed with Karna from poison control pt case is close.

## 2024-05-26 NOTE — BH Assessment (Signed)
 Clinician spoke with [facility] to complete pt's TTS assessment. Clinician provided pt's name, MRN, location, age, room number and provider's name. Secure message completed.    Iris coordinator to update secure chat when assessment time and provider are assigned.  Jackson JONETTA Broach, MS, [facility], [facility] Triage Specialist [phone]

## 2024-05-27 ENCOUNTER — Inpatient Hospital Stay (HOSPITAL_COMMUNITY)
Admission: AD | Admit: 2024-05-27 | Discharge: 2024-06-02 | DRG: 885 | Disposition: A | Discharge status: Home / Self Care | Payer: MEDICAID | Source: Intra-hospital

## 2024-05-27 ENCOUNTER — Encounter (HOSPITAL_COMMUNITY): Payer: Self-pay

## 2024-05-27 ENCOUNTER — Other Ambulatory Visit: Payer: Self-pay

## 2024-05-27 DIAGNOSIS — N3010 Interstitial cystitis (chronic) without hematuria: Secondary | ICD-10-CM | POA: Diagnosis present

## 2024-05-27 DIAGNOSIS — F1390 Sedative, hypnotic, or anxiolytic use, unspecified, uncomplicated: Secondary | ICD-10-CM | POA: Insufficient documentation

## 2024-05-27 DIAGNOSIS — F332 Major depressive disorder, recurrent severe without psychotic features: Principal | ICD-10-CM | POA: Diagnosis present

## 2024-05-27 DIAGNOSIS — Z91199 Patient's noncompliance with other medical treatment and regimen due to unspecified reason: Secondary | ICD-10-CM

## 2024-05-27 DIAGNOSIS — Z79899 Other long term (current) drug therapy: Secondary | ICD-10-CM | POA: Diagnosis not present

## 2024-05-27 DIAGNOSIS — Z813 Family history of other psychoactive substance abuse and dependence: Secondary | ICD-10-CM | POA: Diagnosis not present

## 2024-05-27 DIAGNOSIS — T424X2D Poisoning by benzodiazepines, intentional self-harm, subsequent encounter: Secondary | ICD-10-CM | POA: Diagnosis not present

## 2024-05-27 DIAGNOSIS — Z9049 Acquired absence of other specified parts of digestive tract: Secondary | ICD-10-CM

## 2024-05-27 DIAGNOSIS — F19230 Other psychoactive substance dependence with withdrawal, uncomplicated: Secondary | ICD-10-CM | POA: Diagnosis present

## 2024-05-27 DIAGNOSIS — G43909 Migraine, unspecified, not intractable, without status migrainosus: Secondary | ICD-10-CM | POA: Diagnosis present

## 2024-05-27 DIAGNOSIS — F411 Generalized anxiety disorder: Secondary | ICD-10-CM | POA: Diagnosis present

## 2024-05-27 DIAGNOSIS — Z818 Family history of other mental and behavioral disorders: Secondary | ICD-10-CM | POA: Diagnosis not present

## 2024-05-27 DIAGNOSIS — Z9151 Personal history of suicidal behavior: Secondary | ICD-10-CM | POA: Diagnosis not present

## 2024-05-27 DIAGNOSIS — F429 Obsessive-compulsive disorder, unspecified: Secondary | ICD-10-CM | POA: Diagnosis present

## 2024-05-27 DIAGNOSIS — Z560 Unemployment, unspecified: Secondary | ICD-10-CM

## 2024-05-27 DIAGNOSIS — Z6372 Alcoholism and drug addiction in family: Secondary | ICD-10-CM

## 2024-05-27 DIAGNOSIS — R45851 Suicidal ideations: Secondary | ICD-10-CM | POA: Diagnosis not present

## 2024-05-27 DIAGNOSIS — Z803 Family history of malignant neoplasm of breast: Secondary | ICD-10-CM

## 2024-05-27 DIAGNOSIS — F909 Attention-deficit hyperactivity disorder, unspecified type: Secondary | ICD-10-CM | POA: Diagnosis present

## 2024-05-27 DIAGNOSIS — Z880 Allergy status to penicillin: Secondary | ICD-10-CM | POA: Diagnosis not present

## 2024-05-27 DIAGNOSIS — T424X2A Poisoning by benzodiazepines, intentional self-harm, initial encounter: Secondary | ICD-10-CM | POA: Diagnosis not present

## 2024-05-27 DIAGNOSIS — Z888 Allergy status to other drugs, medicaments and biological substances status: Secondary | ICD-10-CM

## 2024-05-27 DIAGNOSIS — F1320 Sedative, hypnotic or anxiolytic dependence, uncomplicated: Secondary | ICD-10-CM | POA: Diagnosis not present

## 2024-05-27 DIAGNOSIS — F441 Dissociative fugue: Secondary | ICD-10-CM | POA: Diagnosis not present

## 2024-05-27 DIAGNOSIS — F410 Panic disorder [episodic paroxysmal anxiety] without agoraphobia: Secondary | ICD-10-CM | POA: Diagnosis present

## 2024-05-27 DIAGNOSIS — F1520 Other stimulant dependence, uncomplicated: Secondary | ICD-10-CM | POA: Diagnosis not present

## 2024-05-27 MED ORDER — MAGNESIUM HYDROXIDE 400 MG/5ML PO SUSP: 30.0000 mL | Freq: Every day | ORAL | Status: DC | PRN

## 2024-05-27 MED ORDER — DIAZEPAM 5 MG PO TABS: 5.0000 mg | ORAL_TABLET | Freq: Three times a day (TID) | ORAL | Status: DC | PRN

## 2024-05-27 MED ORDER — MAGNESIUM OXIDE -MG SUPPLEMENT 400 (240 MG) MG PO TABS: 400.0000 mg | ORAL_TABLET | Freq: Every day | ORAL | Status: DC

## 2024-05-27 MED ORDER — VENLAFAXINE HCL ER 75 MG PO CP24: 75.0000 mg | ORAL_CAPSULE | Freq: Every day | ORAL | Status: DC

## 2024-05-27 MED ORDER — OLANZAPINE 10 MG IM SOLR: 10.0000 mg | Freq: Three times a day (TID) | INTRAMUSCULAR | Status: DC | PRN

## 2024-05-27 MED ORDER — OLANZAPINE 5 MG PO TBDP: 5.0000 mg | ORAL_TABLET | Freq: Three times a day (TID) | ORAL | Status: DC | PRN

## 2024-05-27 MED ORDER — ENSURE PLUS HIGH PROTEIN PO LIQD
237.0000 mL | Freq: Two times a day (BID) | ORAL | Status: DC
  Administered 2024-05-28 – 2024-06-02 (×10): 237 mL via ORAL
  Filled 2024-05-27 (×13): qty 237

## 2024-05-27 MED ORDER — MAGNESIUM GLYCINATE 100 MG PO CAPS: 1.0000 | ORAL_CAPSULE | Freq: Every day | ORAL | Status: DC

## 2024-05-27 MED ORDER — ACETAMINOPHEN 325 MG PO TABS
650.0000 mg | ORAL_TABLET | Freq: Four times a day (QID) | ORAL | Status: DC | PRN
  Administered 2024-05-30: 650 mg via ORAL
  Filled 2024-05-27: qty 2

## 2024-05-27 MED ORDER — OLANZAPINE 10 MG IM SOLR: 5.0000 mg | Freq: Three times a day (TID) | INTRAMUSCULAR | Status: DC | PRN
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
## 2024-05-27 NOTE — ED Notes (Addendum)
 Writer called [employer] and arranged transport for pt. No ETA given.

## 2024-05-27 NOTE — Plan of Care (Signed)
   Problem: Education: Goal: Knowledge of [facility] Education information/materials will improve Outcome: Progressing Goal: Emotional status will improve Outcome: Progressing Goal: Mental status will improve Outcome: Progressing

## 2024-05-27 NOTE — Progress Notes (Signed)
 Pt has been accepted to [facility] on 05/27/2024 Bed assignment: 404-01  Pt meets inpatient criteria per: Adriana Pontes MD   Attending Physician will be: Dr. Taras MD   Report can be called to:Adult unit: [phone]  Pt can arrive after [facility] will update    Care Team Notified: [facility] Cherylynn Ernst RN, Sharlett Baseman paramedic, Eleanor Piedmont RN  [place] Arine Foley LCSW-A   05/27/2024 11:18 AM

## 2024-05-27 NOTE — ED Notes (Signed)
 Writer called [facility] back at [phone] to give report again, however phone was not answered.

## 2024-05-27 NOTE — Group Note (Signed)
 Date:  05/27/2024 Time:  11:38 PM  Group Topic/Focus:  NA/AA Group    Participation Level:  Active  Participation Quality:  Appropriate  Affect:  Appropriate  Cognitive:  Appropriate  Insight: Appropriate  Engagement in Group:  Engaged  Modes of Intervention:  Discussion  Additional Comments:  Patient participated in group  Bari Moats 05/27/2024, 11:38 PM

## 2024-05-27 NOTE — Group Note (Signed)
 Date:  05/27/2024 Time:  6:05 PM  Group Topic/Focus:  Coping With Mental Health Crisis:   The purpose of this group is to help patients identify strategies for coping with mental health crisis.  Group discusses possible causes of crisis and ways to manage them effectively. Developing a Wellness Toolbox:   The focus of this group is to help patients develop a wellness toolbox with skills and strategies to promote recovery upon discharge. Identifying Needs:   The focus of this group is to help patients identify their personal needs that have been historically problematic and identify healthy behaviors to address their needs.    Participation Level:  Did Not Attend  Participation Quality:    Affect:    Cognitive:    Insight:   Engagement in Group:    Modes of Intervention:    Additional Comments:    Diana Goodman 05/27/2024, 6:05 PM

## 2024-05-27 NOTE — ED Notes (Addendum)
 Writer attempted to give report to [facility] to nurse at [phone]. Writer placed on hold for 20 minutes and then was hung up on.

## 2024-05-27 NOTE — Tx Team (Signed)
 Initial Treatment Plan 05/27/2024 6:53 PM ZORI BENBROOK FMW:[id number]    PATIENT STRESSORS: Loss of husband   Marital or family conflict   Medication change or noncompliance     PATIENT STRENGTHS: Ability for insight  Motivation for treatment/growth    PATIENT IDENTIFIED PROBLEMS: Depression  Anxiety                   DISCHARGE CRITERIA:  Improved stabilization in mood, thinking, and/or behavior  PRELIMINARY DISCHARGE PLAN: Outpatient therapy Return to previous living arrangement  PATIENT/FAMILY INVOLVEMENT: This treatment plan has been presented to and reviewed with the patient, KARELY HURTADO. The patient has been given the opportunity to ask questions and make suggestions.  Myan Suit, RN 05/27/2024, 6:53 PM

## 2024-05-28 LAB — HIV ANTIBODY (ROUTINE TESTING W REFLEX): HIV Screen 4th Generation wRfx: NONREACTIVE

## 2024-05-28 LAB — FOLATE: Folate: 6.6 ng/mL (ref >5.9)

## 2024-05-28 LAB — VITAMIN B12: Vitamin B-12: 572 pg/mL (ref 180–914)

## 2024-05-28 MED ORDER — DIAZEPAM 5 MG PO TABS: 5.0000 mg | ORAL_TABLET | Freq: Once | ORAL | Status: DC

## 2024-05-28 MED ORDER — DIAZEPAM 5 MG PO TABS
5.0000 mg | ORAL_TABLET | Freq: Two times a day (BID) | ORAL | Status: AC
  Filled 2024-05-28: qty 1

## 2024-05-28 MED ORDER — RIBOFLAVIN-MAGNESIUM-FEVERFEW 200-180-50 MG PO TABS
1.0000 | ORAL_TABLET | Freq: Two times a day (BID) | ORAL | Status: DC
  Administered 2024-05-28 – 2024-06-02 (×8): 1 via ORAL

## 2024-05-28 MED ORDER — AMITRIPTYLINE HCL 25 MG PO TABS
25.0000 mg | ORAL_TABLET | Freq: Every day | ORAL | Status: DC
  Filled 2024-05-28 (×2): qty 1

## 2024-05-28 MED ORDER — DESVENLAFAXINE SUCCINATE ER 50 MG PO TB24
50.0000 mg | ORAL_TABLET | Freq: Every day | ORAL | Status: DC
  Administered 2024-05-28 – 2024-05-30 (×3): 50 mg via ORAL
  Filled 2024-05-28 (×3): qty 1

## 2024-05-28 MED ORDER — TOPIRAMATE 25 MG PO TABS
50.0000 mg | ORAL_TABLET | Freq: Two times a day (BID) | ORAL | Status: DC
  Filled 2024-05-28 (×2): qty 2

## 2024-05-28 NOTE — H&P (Incomplete)
 Psychiatric Admission Assessment Adult  Patient Identification: Diana Goodman MRN:  [id number] Date of Evaluation:  05/28/2024 Chief Complaint:  Suicidal ideations [R45.851] Principal Diagnosis: Suicidal ideations Diagnosis:  Principal Problem:   Suicidal ideations  Generalized Anxiety Disorder R/O Mood Disorder vs. Recurrent Depression  CC: I need to get off benzos.  History of Present Illness:Diana Goodman is a 48 year old Caucasian female with prior psychiatric diagnoses significant for MDD recurrent, severe, without psychotic features, suicide ideations, prior suicide attempt with overdosing requiring hospitalization, and GAD.  Patient presents involuntarily to [facility] for worsening depression with increased anxiety resulting in intentionally taking 20, 5 mg Valium  tablets.  BAL less than 15, UDS positive for benzodiazepine.  During this assessment, patient reports she has been struggling with depression and anxiety for the past 15 years.  Reports taking Klonopin  regularly at 1 point.  Added that she was able to get off Klonopin  and remain off until the date of spent in 2015 and presumed Klonopin  shortly after the death of her husband.  Reports she continues taking Klonopin  1 mg 3 times daily from 2015 to November 2024 when she stopped on her own.  She reports a lot of withdrawal sx's and rebound anxiety, but claims that she did not take anything until late May, at which point she was given Valium  5 mg tid.  She reports taking Pristiq  50 mg every day for depression/anxiety earlier this month by her provider, Dr. Claudene at an Urgent Care.  Patient reports she has not taken Valium  for some time, however on 05/26/2024 she was experiencing extreme anxiety and she decided to overdose on 20 tablets of 5 mg of Valium .  Patient added, this was not an attempt for suicide but was because of her severe anxiety.  As patient review, patient did make suicidal statements to her  mother regarding taking the Valium  tablets.   Chart review indicates: Patient became quite agitated and violent in the ED when told that she would need to stay to be monitored, and she required Geodon  and Benadryl  and, at one point, restraints.  She is calmer now, but remains impulsive and unable so far to think through the consequences of her actions.  No psychotic sx's.    Objective: Patient presents alert and oriented to person, time, place, and situation speech clear however, with circumstantial, perseveration and paranoia.  Reporting I do not trust doctors, I do not trust doctors medications and I do not trust anyone.  Added all doctors are evil.  Attention to hygiene is fair.  Thought processes and thought content coherent, logical, and semiorganized.  Objectively not distracted by internal stimuli.  However reported that she takes a lot of gummies every day to calm down.  Added that she gets some Gummies to her 48 year old with diagnosis of ADHD to calm him down.  Made patient aware of the side effects of THC Gummies to have body system and discourage administering this to 48 year old.  Social worker has made aware to follow-up with CPS and disregard.   Mode of transport to Hospital: Current Outpatient (Home) Medication List: PRN medication prior to evaluation:  ED course: Collateral Information: POA/Legal Guardian:  HPI:   -why the patient is admitted in detail- Must include: location , quality , severity, duration, timing, context , modifying factors, and associated signs and symptoms  -Psych review of symptoms not addressed in HPI, including assessment of symptoms of Depression, Bipolar, Anxiety, panic attacks, psychosis, PTSD, OCD  Past Psychiatric Hx: Previous  Psych Diagnoses:  Prior inpatient treatment: Current/prior outpatient treatment: Prior rehab hx: Psychotherapy hx: History of suicide: History of homicide or aggression:  Psychiatric medication history: Psychiatric  medication compliance history: Neuromodulation history:  Current Psychiatrist: Current therapist:   Substance Abuse Hx: Alcohol:  Tobacco: Illicit drugs Rx drug abuse: Rehab hx:  Past Medical History: Medical Diagnoses: Home Rx: Prior Hosp: Prior Surgeries/Trauma: Head trauma, LOC, concussions, seizures:  Allergies: LMP: Contraception: PCP:  Family History: Medical: Psych: Psych Rx: SA/HA: Substance use family hx:  Social History: Childhood (bring, raised, lives now, parents, siblings, schooling, education): Abuse: Marital Status: Sexual orientation: Children: Employment: Peer Group: Housing: Finances: Conservator, museum/gallery:  Associated Signs/Symptoms: Depression Symptoms:  {DEPRESSION SYMPTOMS:20000} (Hypo) Manic Symptoms:  {B[facility] MANIC SYMPTOMS:22872} Anxiety Symptoms:  {[facility] ANXIETY SYMPTOMS:22873} Psychotic Symptoms:  {[facility]HH PSYCHOTIC SYMPTOMS:22874} PTSD Symptoms: {B[facility] PTSD SYMPTOMS:22875} Total Time spent with patient: {Time; 15 min - 8 hours:17441}  Past Psychiatric History: ***  Is the patient at risk to self? {yes no:314532}  Has the patient been a risk to self in the past 6 months? {yes no:314532}  Has the patient been a risk to self within the distant past? {yes no:314532}  Is the patient a risk to others? {yes no:314532}  Has the patient been a risk to others in the past 6 months? {yes no:314532}  Has the patient been a risk to others within the distant past? {yes no:314532}   [place] Scale:  Flowsheet Row [facility] (Current) [facility] from [facility]26/2024 in [facility] Department [facility] ED from 03/28/2024 in [facility] Emergency Department at [facility]  C-SSRS RISK CATEGORY Low Risk Low Risk No Risk     Prior Inpatient Therapy: {yes no:314532} If yes, describe***  Prior Outpatient Therapy: {yes no:314532} If yes, describe***   Alcohol Screening: 1. How often  do you have a drink containing alcohol?: Never 2. How many drinks containing alcohol do you have on a typical day when you are drinking?: 1 or 2 3. How often do you have six or more drinks on one occasion?: Never AUDIT-C Score: 0 4. How often during the last year have you found that you were not able to stop drinking once you had started?: Never 5. How often during the last year have you failed to do what was normally expected from you because of drinking?: Never 6. How often during the last year have you needed a first drink in the morning to get yourself going after a heavy drinking session?: Never 7. How often during the last year have you had a feeling of guilt of remorse after drinking?: Never 8. How often during the last year have you been unable to remember what happened the night before because you had been drinking?: Never 9. Have you or someone else been injured as a result of your drinking?: No 10. Has a relative or friend or a doctor or another health worker been concerned about your drinking or suggested you cut down?: No Alcohol Use Disorder Identification Test Final Score (AUDIT): 0 Alcohol Brief Interventions/Follow-up: Alcohol education/Brief advice Substance Abuse History in the last 12 months:  {yes no:314532} Consequences of Substance Abuse: {[facility] CONSEQUENCES OF SUBSTANCE ABUSE:22880} Previous Psychotropic Medications: {YES/NO:21197} Psychological Evaluations: {YES/NO:21197} Past Medical History:  Past Medical History:  Diagnosis Date   ADHD    Anxiety    Depression    GAD (generalized anxiety disorder)    Headache    IC (interstitial  cystitis)    OCD (obsessive compulsive disorder)    Vitamin D deficiency     Past Surgical History:  Procedure Laterality Date   CHOLECYSTECTOMY N/A 09/12/2018   Procedure: LAPAROSCOPIC CHOLECYSTECTOMY WITH INTRAOPERATIVE CHOLANGIOGRAM ERAS PATHWAY;  Surgeon: Curvin Deward MOULD, MD;  Location: [facility] OR;  Service: General;  Laterality: N/A;    Family History:  Family History  Problem Relation Age of Onset   Depression Mother    Anxiety disorder Mother    Migraines Mother    Depression Father    Anxiety disorder Father    Breast cancer Paternal Aunt    Depression Brother    Anxiety disorder Brother    Family Psychiatric  History: *** Tobacco Screening:  Social History   Tobacco Use  Smoking Status Never  Smokeless Tobacco Never    BH Tobacco Counseling     Are you interested in Tobacco Cessation Medications?  N/A, patient does not use tobacco products Counseled patient on smoking cessation:  N/A, patient does not use tobacco products Reason Tobacco Screening Not Completed: No value filed.       Social History:  Social History   Substance and Sexual Activity  Alcohol Use Yes     Social History   Substance and Sexual Activity  Drug Use No    Additional Social History: Marital status: Widowed Widowed, when?: 06/27/14 husband passed away Are you sexually active?: No What is your sexual orientation?: UTA Has your sexual activity been affected by drugs, alcohol, medication, or emotional stress?: No Does patient have children?: Yes How many children?: 2 How is patient's relationship with their children?: Boy is 77 and the girl is 12, my son has autism and ADHD                         Allergies:   Allergies  Allergen Reactions   Benadryl  [Diphenhydramine ] Other (See Comments)    -restless leg   Penicillins Nausea And Vomiting    Has patient had a PCN reaction causing immediate rash, facial/tongue/throat swelling, SOB or lightheadedness with hypotension: No Has patient had a PCN reaction causing severe rash involving mucus membranes or skin necrosis: No Has patient had a PCN reaction that required hospitalization: No Has patient had a PCN reaction occurring within the last 10 years: Unknown If all of the above answers are NO, then may proceed with Cephalosporin use.    Lab Results:   Results for orders placed or performed during the hospital encounter of 05/26/24 (from the past 48 hours)  Urine rapid drug screen (hosp performed)     Status: Abnormal   Collection Time: 05/26/24  6:57 PM  Result Value Ref Range   Opiates NEGATIVE NEGATIVE   Cocaine NEGATIVE NEGATIVE   Benzodiazepines POSITIVE (A) NEGATIVE   Amphetamines NEGATIVE NEGATIVE   Tetrahydrocannabinol NEGATIVE NEGATIVE   Barbiturates NEGATIVE NEGATIVE   Methadone Scn, Ur NEGATIVE NEGATIVE   Fentanyl  NEGATIVE NEGATIVE    Comment: (NOTE) Drug screen is for Medical Purposes only. Positive results are preliminary only. If confirmation is needed, notify lab within 5 days.  Drug Class                 Cutoff (ng/mL) Amphetamine and metabolites 1000 Barbiturate and metabolites 200 Benzodiazepine              200 Opiates and metabolites     300 Cocaine and metabolites     300 THC  50 Fentanyl                     5 Methadone                   300  Trazodone  is metabolized in vivo to several metabolites,  including pharmacologically active m-CPP, which is excreted in the  urine.  Immunoassay screens for amphetamines and MDMA have potential  cross-reactivity with these compounds and may provide false positive  result.  Performed at [facility], [address], [place], [place] [address]     Blood Alcohol level:  Lab Results  Component Value Date   [facility] <15 05/26/2024   ETH 266 (H) 05/29/2021    Metabolic Disorder Labs:  No results found for: HGBA1C, MPG No results found for: PROLACTIN No results found for: CHOL, TRIG, HDL, CHOLHDL, VLDL, LDLCALC  Current Medications: Current Facility-Administered Medications  Medication Dose Route Frequency Provider Last Rate Last Admin   acetaminophen  (TYLENOL ) tablet 650 mg  650 mg Oral Q6H PRN Trudy Carwin, NP       amitriptyline  (ELAVIL ) tablet 25 mg  25 mg Oral QHS Lindie Roberson C, FNP        desvenlafaxine  (PRISTIQ ) 24 hr tablet 50 mg  50 mg Oral Daily Parker, Alvin S, MD   50 mg at 05/28/24 1512   diazepam  (VALIUM ) tablet 5 mg  5 mg Oral Q12H Olon Russ C, FNP       [START ON 05/29/2024] diazepam  (VALIUM ) tablet 5 mg  5 mg Oral Once Kelin Borum C, FNP       feeding supplement (ENSURE PLUS HIGH PROTEIN) liquid 237 mL  237 mL Oral BID BM Bouchard, Marc A, DO   237 mL at 05/28/24 1512   magnesium  hydroxide (MILK OF MAGNESIA) suspension 30 mL  30 mL Oral Daily PRN Trudy Carwin, NP       OLANZapine  (ZYPREXA ) injection 10 mg  10 mg Intramuscular TID PRN Trudy Carwin, NP       OLANZapine  (ZYPREXA ) injection 5 mg  5 mg Intramuscular TID PRN Trudy Carwin, NP       OLANZapine  zydis (ZYPREXA ) disintegrating tablet 5 mg  5 mg Oral TID PRN Trudy Carwin, NP       Riboflavin -Magnesium -Feverfew 200-180-50 MG TABS 1 tablet  1 tablet Oral BID Kennyth Starleen RAMAN, MD   1 tablet at 05/28/24 1648   topiramate  (TOPAMAX ) tablet 50 mg  50 mg Oral BID Laquita Harlan C, FNP       PTA Medications: Medications Prior to Admission  Medication Sig Dispense Refill Last Dose/Taking   amitriptyline  (ELAVIL ) 50 MG tablet Take 50 mg by mouth at bedtime. (Patient not taking: Reported on 05/26/2024)      clonazePAM  (KLONOPIN ) 1 MG tablet Take 1 mg by mouth at bedtime as needed for sleep. (Patient not taking: Reported on 05/26/2024)      desvenlafaxine  (PRISTIQ ) 50 MG 24 hr tablet Take 50 mg by mouth daily.      diazepam  (VALIUM ) 5 MG tablet Take 5 mg by mouth 3 (three) times daily as needed.      MAGNESIUM  GLYCINATE PO Take 1 tablet by mouth at bedtime.      topiramate  (TOPAMAX ) 50 MG tablet TAKE 1 TABLET(50 MG) BY MOUTH TWICE DAILY (Patient not taking: Reported on 05/26/2024) 60 tablet 3     AIMS:  ,  ,  ,  ,  ,  ,    Musculoskeletal: Strength & Muscle  Tone: {desc; muscle tone:32375} Gait & Station: {PE GAIT ED WJUO:77474} Patient leans: {Patient Leans:21022755}            Psychiatric Specialty  Exam:  Presentation  General Appearance:  Casual  Eye Contact: Minimal (Continue to complain of migraine headache)  Speech: Clear and Coherent  Speech Volume: Normal  Handedness: Right   Mood and Affect  Mood: Anxious; Depressed; Irritable  Affect: Congruent; Depressed   Thought Process  Thought Processes: Coherent; Linear  Duration of Psychotic Symptoms:{Duration of Depression Symptoms:28555} Past Diagnosis of Schizophrenia or Psychoactive disorder: No  Descriptions of Associations:Intact  Orientation:Full (Time, Place and Person)  Thought Content:Rumination; Delusions; Paranoid Ideation  Hallucinations:Hallucinations: None (However patient reports,  the doctors are evil, I do not trust anyone, I do not trust any doctors and I am not taking medication from the doctors.)  Ideas of Reference:None  Suicidal Thoughts:Suicidal Thoughts: No  Homicidal Thoughts:Homicidal Thoughts: No   Sensorium  Memory: Immediate Fair; Recent Fair  Judgment: Poor  Insight: Poor   Executive Functions  Concentration: Fair  Attention Span: Fair  Recall: Fair  Fund of Knowledge: Fair  Language: Fair   Psychomotor Activity  Psychomotor Activity: Psychomotor Activity: Normal; Mannerisms   Assets  Assets: Physical Health; Resilience   Sleep  Sleep: Sleep: Good  Estimated Sleeping Duration (Last 24 Hours): 5.25-6.25 hours   Physical Exam: Physical Exam Vitals and nursing note reviewed.  Constitutional:      Appearance: She is normal weight.  HENT:     Head: Normocephalic.     Right Ear: External ear normal.     Left Ear: External ear normal.     Nose: Nose normal.     Mouth/Throat:     Mouth: Mucous membranes are moist.     Pharynx: Oropharynx is clear.  Eyes:     Extraocular Movements: Extraocular movements intact.  Cardiovascular:     Rate and Rhythm: Tachycardia present.  Pulmonary:     Effort: Pulmonary effort is normal. No  respiratory distress.  Abdominal:     Comments: Deferred  Genitourinary:    Comments: Deferred  Musculoskeletal:        General: Normal range of motion.     Cervical back: Normal range of motion.  Skin:    General: Skin is warm.  Neurological:     General: No focal deficit present.     Mental Status: She is alert and oriented to person, place, and time.  Psychiatric:        Mood and Affect: Mood normal.        Behavior: Behavior normal.    Review of Systems  Constitutional:  Negative for chills and fever.  HENT:  Negative for sore throat.   Eyes:  Negative for blurred vision.  Respiratory:  Negative for cough, sputum production, shortness of breath and wheezing.   Cardiovascular:  Negative for chest pain and palpitations.  Gastrointestinal:  Negative for abdominal pain, constipation, diarrhea, heartburn, nausea and vomiting.  Genitourinary:  Negative for dysuria, frequency and urgency.  Musculoskeletal:  Negative for falls.  Skin:  Negative for itching and rash.  Neurological:  Negative for dizziness and headaches.  Endo/Heme/Allergies:        See allergy listing  Psychiatric/Behavioral:  Positive for depression and substance abuse. Negative for hallucinations and suicidal ideas. The patient is nervous/anxious. The patient does not have insomnia.    Blood pressure 114/84, pulse (!) 105, temperature 98 F (36.7 C), temperature source Oral, resp. rate 18, height 5'  4 (1.626 m), weight 61.5 kg, SpO2 99%. Body mass index is 23.26 kg/m.  Treatment Plan Summary: {CHL [facility] MD [place] Eojw:695299749}  Observation Level/Precautions:  {Observation Level/Precautions:22681}  Laboratory:  {Laboratory:22682}  Psychotherapy:    Medications:    Consultations:    Discharge Concerns:    Estimated LOS:  Other:     Physician Treatment Plan for Primary Diagnosis: Suicidal ideations Long Term Goal(s): {[facility] MD Tx Plan Long Term Goals:30414007::Improvement in symptoms so as ready for  discharge}  Short Term Goals: [facility] MD Tx Plan Short Term Hnjod:69585996}  Physician Treatment Plan for Secondary Diagnosis: Principal Problem:   Suicidal ideations  Long Term Goal(s): {[facility] MD Tx Plan Long Term Goals:30414007::Improvement in symptoms so as ready for discharge}  Short Term Goals: [facility] MD Tx Plan Short Term Hnjod:69585996}  I certify that inpatient services furnished can reasonably be expected to improve the patient's condition.    Ellouise JAYSON Azure, FNP 8/28/20256:47 PM

## 2024-05-28 NOTE — Progress Notes (Addendum)
 D: Patient is alert and oriented. Denies SI, HI, AVH, and verbally contracts for safety. Patient complains of migraine and vomiting.    A: Patient refused topamax . Other scheduled medications administered per MD order. Support provided. Patient educated on safety on the unit and medications. Routine safety checks every 15 minutes. Patient stated understanding to tell nurse about any new physical symptoms. Patient understands to tell staff of any needs.     R: No adverse drug reactions noted. Patient remains safe at this time and will continue to monitor.    05/28/24 1000  Psych Admission Type (Psych Patients Only)  Admission Status Involuntary  Psychosocial Assessment  Patient Complaints Anxiety;Irritability  Eye Contact Fair  Facial Expression Anxious  Affect Anxious;Irritable  Speech Logical/coherent  Interaction Assertive  Motor Activity Fidgety  Appearance/Hygiene Unremarkable  Behavior Characteristics Cooperative;Appropriate to situation  Mood Anxious;Depressed  Thought Process  Coherency WDL  Content WDL  Delusions None reported or observed  Perception WDL  Hallucination None reported or observed  Judgment Poor  Confusion None  Danger to Self  Current suicidal ideation? Denies  Agreement Not to Harm Self Yes  Description of Agreement verbal  Danger to Others  Danger to Others None reported or observed

## 2024-05-28 NOTE — [facility] Suicide Risk Assessment (Signed)
 [facility] INPATIENT:  Family/Significant Other Suicide Prevention Education  Suicide Prevention Education:  Patient Refusal for Family/Significant Other Suicide Prevention Education: The patient Diana Goodman has refused to provide written consent for family/significant other to be provided Family/Significant Other Suicide Prevention Education during admission and/or prior to discharge.  Physician notified.  Jenkins LULLA Primer 05/28/2024, 10:46 AM

## 2024-05-28 NOTE — Plan of Care (Signed)
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
## 2024-05-28 NOTE — Group Note (Signed)
 LCSW Group Therapy Note   Group Date: 05/28/2024 Start Time: 1100 End Time: 1200   Participation:  patient was present  Type of Therapy:  Group Therapy  Topic:  Healing From Within: Understanding Our Past, Building Our Future"  Objective:  To help participants understand the impact of early experiences on mental and physical health, with a focus on Adverse Childhood Experiences (ACEs), and to explore ways to build resilience and healing.  Group Goals: Understand ACEs and Their Impact: Learn how childhood experiences shape mental and physical health. Build Resilience: Develop strategies for overcoming challenges and creating positive change. Promote Healing: Recognize the value of support and the possibility of healing through therapy and self-care.  Summary: In today's session, we discussed how early experiences, especially ACEs, impact mental and physical health. We explored the effects of stress, abuse, and neglect on brain development and well-being. The group focused on resilience, understanding that healing and positive change are possible with support and self-awareness.  Therapeutic Modalities Used: Psychoeducation: Sharing information about ACEs and their effects. Cognitive Behavioral Therapy (CBT): Helping reframe negative thought patterns. Trauma-Informed Therapy: Creating a safe, supportive space for healing.   Montzerrat Brunell O Paiden Cavell, LCSWA 05/28/2024  6:38 PM

## 2024-05-28 NOTE — Group Note (Signed)
 Date:  05/28/2024 Time:  9:11 PM  Group Topic/Focus:  Wrap-Up Group:   The focus of this group is to help patients review their daily goal of treatment and discuss progress on daily workbooks.    Participation Level:  None  Participation Quality:  Appropriate  Affect:  Appropriate  Cognitive:  Alert  Insight: None  Engagement in Group:  None  Modes of Intervention:  Discussion and Education  Additional Comments:  Pt attended wrap up group this evening, but opted out of participating in group.   Rosella DELENA Pouch 05/28/2024, 9:11 PM

## 2024-05-28 NOTE — Progress Notes (Signed)
(  Sleep Hours) -6.0 (Any PRNs that were needed, meds refused, or side effects to meds)- none (Any disturbances and when (visitation, over night)-none (Concerns raised by the patient)- Patient states she wants to get off all drugs unless they are holistic. Pt c/o headache last evening but refused to take anything. Also c/o some anxiety, but refused to take anything (SI/HI/AVH)- Denies all

## 2024-05-28 NOTE — Progress Notes (Signed)
 I received a consult to provide spiritual and emotional support to Diana Goodman.  I introduced myself to her today and she stated that she was having a headache and asked if we could talk further tomorrow.  She did share about some medical anxiety including anxiety about medication, preventative screenings that might potentially cause cancer and vaccines for her children. She also shared about her husband's death 10 years ago and how she has been coping since by using substances.  I will check in with her tomorrow to see if she wants to talk further.

## 2024-05-28 NOTE — [facility] Group Notes (Signed)

## 2024-05-28 NOTE — Group Note (Signed)
 Date:  05/28/2024 Time:  4:47 PM  Group Topic/Focus:  Medication side effect    Participation Level:  Active  Participation Quality:  Appropriate and Attentive  Affect:  Appropriate  Cognitive:  Alert and Appropriate  Insight: Appropriate and Good  Engagement in Group:  Engaged  Modes of Intervention:  Discussion and Education    Almarie MALVA Lowers 05/28/2024, 4:47 PM

## 2024-05-29 ENCOUNTER — Encounter (HOSPITAL_COMMUNITY): Payer: Self-pay

## 2024-05-29 LAB — RPR: RPR Ser Ql: NONREACTIVE

## 2024-05-29 LAB — HEMOGLOBIN A1C
Hgb A1c MFr Bld: 5.2 % (ref 4.8–5.6)
Mean Plasma Glucose: 103 mg/dL

## 2024-05-29 NOTE — Plan of Care (Signed)
   Problem: Education: Goal: Knowledge of [facility] Education information/materials will improve Outcome: Progressing Goal: Emotional status will improve Outcome: Progressing Goal: Mental status will improve Outcome: Progressing

## 2024-05-29 NOTE — Progress Notes (Signed)
 D: Patient is alert, oriented, pleasant, and cooperative. Denies SI, HI, AVH, and verbally contracts for safety.    A: Patient refused valium  and topamax . Other scheduled medications administered per MD order. Support provided. Patient educated on safety on the unit and medications. Routine safety checks every 15 minutes. Patient stated understanding to tell nurse about any new physical symptoms. Patient understands to tell staff of any needs.     R: No adverse drug reactions noted. Patient remains safe at this time and will continue to monitor.    05/29/24 1100  Psych Admission Type (Psych Patients Only)  Admission Status Involuntary  Psychosocial Assessment  Patient Complaints Anxiety  Eye Contact Fair  Facial Expression Animated  Affect Anxious  Speech Logical/coherent  Interaction Assertive  Motor Activity Fidgety  Appearance/Hygiene Unremarkable  Behavior Characteristics Cooperative;Appropriate to situation  Mood Anxious;Depressed;Pleasant  Thought Process  Coherency WDL  Content WDL  Delusions None reported or observed  Perception WDL  Hallucination None reported or observed  Judgment Impaired  Confusion None  Danger to Self  Current suicidal ideation? Denies  Agreement Not to Harm Self Yes  Description of Agreement verbal  Danger to Others  Danger to Others None reported or observed

## 2024-05-29 NOTE — Group Note (Signed)
 Recreation Therapy Group Note   Group Topic:Problem Solving  Group Date: 05/29/2024 Start Time: 0935 End Time: 1003 Facilitators: Sabastion Hrdlicka-McCall, LRT,CTRS Location: [address] Dayroom   Group Topic: Communication, Team Building, Problem Solving  Goal Area(s) Addresses:  Patient will effectively work with peer towards shared goal.  Patient will identify skills used to make activity successful.  Patient will identify how skills used during activity can be used to reach post d/c goals.   Behavioral Response: Engaged  Intervention: STEM Activity  Activity: Straw Bridge. In teams of 3-5, patients were given 15 plastic drinking straws and an equal length of masking tape. Using the materials provided, patients were instructed to build a free standing bridge-like structure to suspend an everyday item (ex: puzzle box) off of the floor or table surface. All materials were required to be used by the team in their design. LRT facilitated post-activity discussion reviewing team process. Patients were encouraged to reflect how the skills used in this activity can be generalized to daily life post discharge.   Education: Pharmacist, community, Scientist, physiological, Discharge Planning   Education Outcome: Acknowledges education/In group clarification offered/Needs additional education.    Affect/Mood: Appropriate   Participation Level: Engaged   Participation Quality: Independent   Behavior: Appropriate   Speech/Thought Process: Focused   Insight: Good   Judgement: Good   Modes of Intervention: STEM Activity   Patient Response to Interventions:  Engaged   Education Outcome:  In group clarification offered    Clinical Observations/Individualized Feedback: Pt came in late to group but joined right in with helping one of the groups create their bridge. Pt was vocal and hands on with putting the structure together.      Plan: Continue to engage patient in RT group sessions  2-3x/week.   Dezzie Badilla-McCall, LRT,CTRS 05/29/2024 12:51 PM

## 2024-05-29 NOTE — Group Note (Signed)
 Date:  05/29/2024 Time:  5:07 PM  Group Topic/Focus:  Recovery Goals:   The focus of this group is to identify appropriate goals for recovery and establish a plan to achieve them.    Additional Comments:  pt did not attend   Powell JAYSON Sharps 05/29/2024, 5:07 PM

## 2024-05-29 NOTE — Progress Notes (Signed)
(  Sleep Hours) -8.25 (Any PRNs that were needed, meds refused, or side effects to meds)-Pt had no prns and refused scheduled elavil  and valium  that was due at 2200  (Any disturbances and when (visitation, over night)-none (Concerns raised by the patient)-none  (SI/HI/AVH)- Denies all

## 2024-05-29 NOTE — Group Note (Signed)
 Date:  05/29/2024 Time:  9:43 PM  Group Topic/Focus:  Wrap-Up Group:   The focus of this group is to help patients review their daily goal of treatment and discuss progress on daily workbooks.    Participation Level:  Active  Participation Quality:  Appropriate  Affect:  Appropriate  Cognitive:  Appropriate  Insight: Improving  Engagement in Group:  Engaged  Modes of Intervention:  Discussion  Additional Comments:  Pt attended the evening wrap-up group. Tech introduced the staff for the evening, reminded group of the evening schedule and reminded them to ask for anything they need. Pt discussed their goal post discharge and their definition of resilience. Pt shared how she will maintain her goals post discharge.  [name] [place] 05/29/2024, 9:43 PM

## 2024-05-29 NOTE — Group Note (Signed)
 Date:  05/29/2024 Time:  10:14 AM  Group Topic/Focus:  Goals Group:   The focus of this group is to help patients establish daily goals to achieve during treatment and discuss how the patient can incorporate goal setting into their daily lives to aide in recovery.    Participation Level:  Active  Participation Quality:  Appropriate and Attentive  Affect:  Appropriate  Cognitive:  Appropriate  Insight: Appropriate  Engagement in Group:  Engaged  Modes of Intervention:  Discussion  Additional Comments:  N/A  Ayinde Swim A Jamarkis Branam 05/29/2024, 10:14 AM

## 2024-05-29 NOTE — Plan of Care (Signed)

## 2024-05-29 NOTE — Progress Notes (Addendum)
 [facility] MD Progress Note  05/29/2024 2:53 PM Diana Goodman  MRN:  [id number] Subjective:  Diana Goodman says, I feel much better with my anxiety, and I still want to get off my Benzos.' Principal Problem: Suicidal ideations Diagnosis: Principal Problem:   Suicidal ideations  Reason for admission: Diana Goodman is a 48 year old Caucasian female with prior psychiatric diagnoses significant for MDD recurrent, severe, without psychotic features, suicide ideations, prior suicide attempt with overdosing requiring hospitalization, and GAD.  Patient presents involuntarily to [facility] for worsening depression with increased anxiety, resulting in intentionally swallowing of 20, 5 mg Valium  tablets in the context of psychosocial stressors.  BAL less than 15, UDS positive for benzodiazepine.   24-hour chart review: Patient case discussed in interdisciplinary team meeting.  Vital signs without critical values.  Noncompliant with scheduled psychotropic medications of Elavil , Valium , and Topamax .  Today's assessment notes: On assessment today, the pt reports that her mood is improving and less depressed.  Rates depression as #0/10, being more severe.  Presents alert, calm, pleasant and oriented to person, place, time, and situation.  Speech clear coherent with normal volume and pattern.  Chart reviewed and findings shared with the treatment team and consults with attending psychiatrist with recommendation to continue current treatment plan as in progress.  Denies paranoia, ideas of reference, or delusions.  Further denies SI, HI, or AVH.  Observed attending and participating in therapeutic milieu and unit group activities.  She rates her migraine headache as #2 /10, with 10 being most severe and continues with Riboflavin -Magnesium -Feverfew 200-180-50 MG TABS 1 tablet p.o. twice daily for migraine headache. Reports that anxiety is at manageable level Sleep is stable.  Nursing staff report  patient sleeping 8.25 hours and being restful Appetite is stable Concentration is improving Energy level is adequate  Denies having side effects to current psychiatric medications.   We discussed compliance to current medication regimen.    Total Time spent with patient: 45 minutes  Past Psychiatric History: Previous Psych Diagnoses: MDD severe, recurrent without psychotic features, SI, GAD. Prior inpatient treatment: Yes x 2 in 2013 Current/prior outpatient treatment: Yes Prior rehab hx: Denies Psychotherapy hx: Yes History of suicide: Yes x 3 and was hospitalized History of homicide or aggression: Denies Psychiatric medication history: Patient has been on trial of Valium , Pristiq , Elavil  Psychiatric medication compliance history: Noncompliance Neuromodulation history: Denies Current Psychiatrist: Dr. Claudene, patient refers to him as an IDIOT Current therapist: Yes Randine Arabia  Past Medical History:  Past Medical History:  Diagnosis Date   ADHD    Anxiety    Depression    GAD (generalized anxiety disorder)    Headache    IC (interstitial cystitis)    OCD (obsessive compulsive disorder)    Vitamin D deficiency     Past Surgical History:  Procedure Laterality Date   CHOLECYSTECTOMY N/A 09/12/2018   Procedure: LAPAROSCOPIC CHOLECYSTECTOMY WITH INTRAOPERATIVE CHOLANGIOGRAM ERAS PATHWAY;  Surgeon: Curvin Deward MOULD, MD;  Location: [facility] OR;  Service: General;  Laterality: N/A;   Family History:  Family History  Problem Relation Age of Onset   Depression Mother    Anxiety disorder Mother    Migraines Mother    Depression Father    Anxiety disorder Father    Breast cancer Paternal Aunt    Depression Brother    Anxiety disorder Brother    Family Psychiatric  History: See H&P Social History:  Social History   Substance and Sexual Activity  Alcohol Use  Yes     Social History   Substance and Sexual Activity  Drug Use No    Social History   Socioeconomic History    Marital status: Widowed    Spouse name: Not on file   Number of children: 2   Years of education: Not on file   Highest education level: Some college, no degree  Occupational History   Not on file  Tobacco Use   Smoking status: Never   Smokeless tobacco: Never  Substance and Sexual Activity   Alcohol use: Yes   Drug use: No   Sexual activity: Not on file  Other Topics Concern   Not on file  Social History Narrative   Patient is right-handed. She rarely drinks caffeine. She does not regularly exercise.   Social Drivers of Corporate investment banker Strain: Not on file  Food Insecurity: No Food Insecurity (05/27/2024)   Hunger Vital Sign    Worried About Running Out of Food in the Last Year: Never true    Ran Out of Food in the Last Year: Never true  Transportation Needs: No Transportation Needs (05/27/2024)   PRAPARE - Administrator, Civil Service (Medical): No    Lack of Transportation (Non-Medical): No  Physical Activity: Not on file  Stress: Not on file  Social Connections: Not on file   Additional Social History:    Sleep: Good Estimated Sleeping Duration (Last 24 Hours): 7.75 hours  Appetite:  Good  Current Medications: Current Facility-Administered Medications  Medication Dose Route Frequency Provider Last Rate Last Admin   acetaminophen  (TYLENOL ) tablet 650 mg  650 mg Oral Q6H PRN Trudy Carwin, NP       amitriptyline  (ELAVIL ) tablet 25 mg  25 mg Oral QHS Rifka Ramey C, FNP       desvenlafaxine  (PRISTIQ ) 24 hr tablet 50 mg  50 mg Oral Daily Parker, Alvin S, MD   50 mg at 05/29/24 9167   diazepam  (VALIUM ) tablet 5 mg  5 mg Oral Once Seraphine Gudiel C, FNP       feeding supplement (ENSURE PLUS HIGH PROTEIN) liquid 237 mL  237 mL Oral BID BM Bouchard, Marc A, DO   237 mL at 05/29/24 1345   magnesium  hydroxide (MILK OF MAGNESIA) suspension 30 mL  30 mL Oral Daily PRN Trudy Carwin, NP       OLANZapine  (ZYPREXA ) injection 10 mg  10 mg Intramuscular TID PRN  Trudy Carwin, NP       OLANZapine  (ZYPREXA ) injection 5 mg  5 mg Intramuscular TID PRN Trudy Carwin, NP       OLANZapine  zydis (ZYPREXA ) disintegrating tablet 5 mg  5 mg Oral TID PRN Trudy Carwin, NP       Riboflavin -Magnesium -Feverfew 200-180-50 MG TABS 1 tablet  1 tablet Oral BID Parker, Alvin S, MD   1 tablet at 05/29/24 9167   topiramate  (TOPAMAX ) tablet 50 mg  50 mg Oral BID Preciliano Castell C, FNP       Lab Results:  Results for orders placed or performed during the hospital encounter of 05/27/24 (from the past 48 hours)  Folate     Status: None   Collection Time: 05/28/24  6:34 PM  Result Value Ref Range   Folate 6.6 >5.9 ng/mL    Comment: Performed at [facility], [address], [place], [place] [address]  Vitamin B12     Status: None   Collection Time: 05/28/24  6:34 PM  Result Value Ref  Range   Vitamin B-12 572 180 - 914 pg/mL    Comment: Performed at [facility], [address], [place], [place] [address]  RPR     Status: None   Collection Time: 05/28/24  6:34 PM  Result Value Ref Range   RPR Ser Ql NON REACTIVE NON REACTIVE    Comment: Performed at [facility], [address] [place], [place] [address]  Hemoglobin A1c     Status: None   Collection Time: 05/28/24  6:34 PM  Result Value Ref Range   Hgb A1c MFr Bld 5.2 4.8 - 5.6 %    Comment: (NOTE)         Prediabetes: 5.7 - 6.4         Diabetes: >6.4         Glycemic control for adults with diabetes: <7.0    Mean Plasma Glucose 103 mg/dL    Comment: (NOTE) Performed At: [facility] [address] [place], [place] [id number] Jennette Shorter MD Ey:[id number]   HIV Antibody (routine testing w rflx)     Status: None   Collection Time: 05/28/24  6:34 PM  Result Value Ref Range   HIV Screen 4th Generation wRfx Non Reactive Non Reactive    Comment: Performed at [facility], [address], [place], [place] [address]   Blood Alcohol level:  Lab Results   Component Value Date   ETH <15 05/26/2024   ETH 266 (H) 05/29/2021   Metabolic Disorder Labs: Lab Results  Component Value Date   HGBA1C 5.2 05/28/2024   MPG 103 05/28/2024   No results found for: PROLACTIN No results found for: CHOL, TRIG, HDL, CHOLHDL, VLDL, LDLCALC  Physical Findings: AIMS:  ,  ,  ,  ,  ,  ,   CIWA:    COWS:     Musculoskeletal: Strength & Muscle Tone: within normal limits Gait & Station: normal Patient leans: N/A  Psychiatric Specialty Exam:  Presentation  General Appearance:  Appropriate for Environment; Casual; Fairly Groomed  Eye Contact: Good  Speech: Clear and Coherent; Normal Rate  Speech Volume: Normal  Handedness: Right  Mood and Affect  Mood: Anxious (pleasant)  Affect: Appropriate  Thought Process  Thought Processes: Coherent; Goal Directed  Descriptions of Associations:Intact  Orientation:Full (Time, Place and Person)  Thought Content:Logical  History of Schizophrenia/Schizoaffective disorder:No  Duration of Psychotic Symptoms:N/A  Hallucinations:Hallucinations: None  Ideas of Reference:None  Suicidal Thoughts:Suicidal Thoughts: No  Homicidal Thoughts:Homicidal Thoughts: No  Sensorium  Memory: Immediate Good; Recent Good  Judgment: Fair  Insight: Fair  Art therapist  Concentration: Good  Attention Span: Good  Recall: Fair  Fund of Knowledge: Fair  Language: Good  Psychomotor Activity  Psychomotor Activity: Psychomotor Activity: Normal  Assets  Assets: Communication Skills; Physical Health; Resilience  Sleep  Sleep: Sleep: Good Number of Hours of Sleep: 8.25  Physical Exam: Physical Exam Vitals and nursing note reviewed.  Constitutional:      Appearance: She is normal weight.  HENT:     Head: Normocephalic.     Right Ear: External ear normal.     Left Ear: External ear normal.     Nose: Nose normal.     Mouth/Throat:     Mouth: Mucous membranes  are moist.     Pharynx: Oropharynx is clear.  Eyes:     Extraocular Movements: Extraocular movements intact.  Cardiovascular:     Rate and Rhythm: Normal rate.     Pulses:  Normal pulses.  Pulmonary:     Effort: Pulmonary effort is normal.  Abdominal:     Comments: Deferred   Genitourinary:    Comments: Deferred   Musculoskeletal:        General: Normal range of motion.     Cervical back: Normal range of motion.  Skin:    General: Skin is warm.  Neurological:     General: No focal deficit present.     Mental Status: She is alert and oriented to person, place, and time.  Psychiatric:        Mood and Affect: Mood normal.        Behavior: Behavior normal.        Thought Content: Thought content normal.    Review of Systems  Constitutional:  Negative for chills and fever.  HENT:  Negative for sore throat.   Eyes:  Negative for blurred vision.  Respiratory:  Negative for cough, sputum production, shortness of breath and wheezing.   Cardiovascular:  Negative for chest pain and palpitations.  Gastrointestinal:  Negative for abdominal pain, constipation, diarrhea, heartburn, nausea and vomiting.  Genitourinary:  Negative for dysuria and urgency.  Musculoskeletal:  Negative for falls and myalgias.  Skin:  Negative for itching and rash.  Neurological:  Positive for headaches (Improving, rates HA as #2/10, with 10 being more severe). Negative for dizziness.  Endo/Heme/Allergies:        See allergy listing  Psychiatric/Behavioral:  Positive for depression. Negative for hallucinations, substance abuse and suicidal ideas. The patient is nervous/anxious. The patient does not have insomnia.    Blood pressure 113/86, pulse 98, temperature 97.8 F (36.6 C), temperature source Oral, resp. rate 18, height 5' 4 (1.626 m), weight 61.5 kg, SpO2 100%. Body mass index is 23.26 kg/m.  Treatment Plan Summary: Daily contact with patient to assess and evaluate symptoms and progress in treatment  and Medication management   Observation Level/Precautions:  15 minute checks  Laboratory:  CBC: Within normal limits Chemistry Profile: Within normal limit Folic Acid: 6.6 normal HbAIC: 5.2 normal HCG: Urine pregnancy test negative RPR: Non-reactive HIV: Nonreactive UDS: Positive for benzodiazepine UA: Ordered Vitamin B-12: 572  Psychotherapy: Yes  Medications: See MAR  Consultations: Pending  Discharge Concerns: None  Estimated LOS: 3 to 7 days  Other:     Assessment: ROSIBEL GIACOBBE is a 48 year old Caucasian female with prior psychiatric diagnoses significant for MDD recurrent, severe, without psychotic features, suicide ideations, prior suicide attempt with overdosing requiring hospitalization, and GAD.  Patient presents involuntarily to [facility] for worsening depression with increased anxiety, resulting in intentionally swallowing of 20, 5 mg Valium  tablets in the context of psychosocial stressors.  BAL less than 15, UDS positive for benzodiazepine.   Physician Treatment Plan for Primary Diagnosis: Suicidal ideations   Plan: Medications: --Continue amitriptyline  tablet 25 mg p.o. daily at bedtime --Continue Pristiq  24-hour tablet 50 mg p.o. daily for depression --Continue Valium  5 mg p.o. every 12 hours x 2 doses only --Continue Valium  5 mg X 1 dose on 05/29/2024, then stop   Continue [facility] agitation protocol   Medication for other medical problems: --Continue home medication Riboflavin -Magnesium -Feverfew 200-180-50 MG TABS 1 tablet p.o. twice daily for migraine headache --Continue feeding supplement (ENSURE PLUS HIGH PROTEIN) liquid 237 mL    Other PRN Medications  -Acetaminophen  650 mg every 6 as needed/mild pain  -Maalox 30 mL oral every 4 as needed/digestion  -Magnesium  hydroxide 30 mL daily as needed/mild constipation    --  The risks/benefits/side-effects/alternatives to this medication were discussed in detail with the patient and time was given  for questions. The patient consents to medication trial.   -- Metabolic profile and EKG monitoring obtained while on an atypical antipsychotic (BMI: Lipid Panel: HbgA1c: QTc:)   -- Encouraged patient to participate in unit milieu and in scheduled group therapies    Safety and Monitoring:  Voluntary admission to inpatient psychiatric unit for safety, stabilization and treatment  Daily contact with patient to assess and evaluate symptoms and progress in treatment  Patient's case to be discussed in multi-disciplinary team meeting  Observation Level : q15 minute checks  Vital signs: q12 hours  Precautions: suicide, but pt currently verbally contracts for safety on unit?    Discharge Planning:  Social work and case management to assist with discharge planning and identification of hospital follow-up needs prior to discharge  Estimated LOS: 5-7?days  Safety plan; Medication compliance and effectiveness  Discharge Goals: Return home with outpatient referrals for mental health follow-up including medication management / psychotherapy.    Long Term Goal(s): Improvement in symptoms so as ready for discharge   Short Term Goals: Ability to identify changes in lifestyle to reduce recurrence of condition will improve, Ability to verbalize feelings will improve, Ability to disclose and discuss suicidal ideas, Ability to demonstrate self-control will improve, Ability to identify and develop effective coping behaviors will improve, Ability to maintain clinical measurements within normal limits will improve, Compliance with prescribed medications will improve, and Ability to identify triggers associated with substance abuse/mental health issues will improve   Physician Treatment Plan for Secondary Diagnosis: Principal Problem:   Suicidal ideations   I certify that inpatient services furnished can reasonably be expected to improve the patient's condition.    Diana JAYSON Azure, FNP 05/29/2024, 2:53 PM

## 2024-05-29 NOTE — BH IP Treatment Plan (Signed)
 Interdisciplinary Treatment and Diagnostic Plan Update  05/29/2024 Time of Session: 10:35 AM Diana Goodman MRN: [id number]  Principal Diagnosis: Suicidal ideations  Secondary Diagnoses: Principal Problem:   Suicidal ideations   Current Medications:  Current Facility-Administered Medications  Medication Dose Route Frequency Provider Last Rate Last Admin   acetaminophen  (TYLENOL ) tablet 650 mg  650 mg Oral Q6H PRN Trudy Carwin, NP       amitriptyline  (ELAVIL ) tablet 25 mg  25 mg Oral QHS Ntuen, Tina C, FNP       desvenlafaxine  (PRISTIQ ) 24 hr tablet 50 mg  50 mg Oral Daily Parker, Alvin S, MD   50 mg at 05/29/24 9167   diazepam  (VALIUM ) tablet 5 mg  5 mg Oral Once Ntuen, Tina C, FNP       feeding supplement (ENSURE PLUS HIGH PROTEIN) liquid 237 mL  237 mL Oral BID BM Bouchard, Marc A, DO   237 mL at 05/29/24 1345   magnesium  hydroxide (MILK OF MAGNESIA) suspension 30 mL  30 mL Oral Daily PRN Trudy Carwin, NP       OLANZapine  (ZYPREXA ) injection 10 mg  10 mg Intramuscular TID PRN Trudy Carwin, NP       OLANZapine  (ZYPREXA ) injection 5 mg  5 mg Intramuscular TID PRN Trudy Carwin, NP       OLANZapine  zydis (ZYPREXA ) disintegrating tablet 5 mg  5 mg Oral TID PRN Trudy Carwin, NP       Riboflavin -Magnesium -Feverfew 200-180-50 MG TABS 1 tablet  1 tablet Oral BID Parker, Alvin S, MD   1 tablet at 05/29/24 9167   topiramate  (TOPAMAX ) tablet 50 mg  50 mg Oral BID Ntuen, Tina C, FNP       PTA Medications: Medications Prior to Admission  Medication Sig Dispense Refill Last Dose/Taking   amitriptyline  (ELAVIL ) 50 MG tablet Take 50 mg by mouth at bedtime. (Patient not taking: Reported on 05/26/2024)      clonazePAM  (KLONOPIN ) 1 MG tablet Take 1 mg by mouth at bedtime as needed for sleep. (Patient not taking: Reported on 05/26/2024)      desvenlafaxine  (PRISTIQ ) 50 MG 24 hr tablet Take 50 mg by mouth daily.      diazepam  (VALIUM ) 5 MG tablet Take 5 mg by mouth 3 (three) times daily as needed.       MAGNESIUM  GLYCINATE PO Take 1 tablet by mouth at bedtime.      topiramate  (TOPAMAX ) 50 MG tablet TAKE 1 TABLET(50 MG) BY MOUTH TWICE DAILY (Patient not taking: Reported on 05/26/2024) 60 tablet 3     Patient Stressors: Loss of husband   Marital or family conflict   Medication change or noncompliance    Patient Strengths: Ability for insight  Motivation for treatment/growth   Treatment Modalities: Medication Management, Group therapy, Case management,  1 to 1 session with clinician, Psychoeducation, Recreational therapy.   Physician Treatment Plan for Primary Diagnosis: Suicidal ideations Long Term Goal(s): Improvement in symptoms so as ready for discharge   Short Term Goals: Ability to identify changes in lifestyle to reduce recurrence of condition will improve Ability to verbalize feelings will improve Ability to disclose and discuss suicidal ideas Ability to demonstrate self-control will improve Ability to identify and develop effective coping behaviors will improve Ability to maintain clinical measurements within normal limits will improve Compliance with prescribed medications will improve Ability to identify triggers associated with substance abuse/mental health issues will improve  Medication Management: Evaluate patient's response, side effects, and tolerance of medication regimen.  Therapeutic  Interventions: 1 to 1 sessions, Unit Group sessions and Medication administration.  Evaluation of Outcomes: Not Progressing  Physician Treatment Plan for Secondary Diagnosis: Principal Problem:   Suicidal ideations  Long Term Goal(s): Improvement in symptoms so as ready for discharge   Short Term Goals: Ability to identify changes in lifestyle to reduce recurrence of condition will improve Ability to verbalize feelings will improve Ability to disclose and discuss suicidal ideas Ability to demonstrate self-control will improve Ability to identify and develop effective coping  behaviors will improve Ability to maintain clinical measurements within normal limits will improve Compliance with prescribed medications will improve Ability to identify triggers associated with substance abuse/mental health issues will improve     Medication Management: Evaluate patient's response, side effects, and tolerance of medication regimen.  Therapeutic Interventions: 1 to 1 sessions, Unit Group sessions and Medication administration.  Evaluation of Outcomes: Not Progressing   RN Treatment Plan for Primary Diagnosis: Suicidal ideations Long Term Goal(s): Knowledge of disease and therapeutic regimen to maintain health will improve  Short Term Goals: Ability to remain free from injury will improve, Ability to verbalize frustration and anger appropriately will improve, Ability to demonstrate self-control, Ability to participate in decision making will improve, Ability to verbalize feelings will improve, Ability to disclose and discuss suicidal ideas, Ability to identify and develop effective coping behaviors will improve, and Compliance with prescribed medications will improve  Medication Management: RN will administer medications as ordered by provider, will assess and evaluate patient's response and provide education to patient for prescribed medication. RN will report any adverse and/or side effects to prescribing provider.  Therapeutic Interventions: 1 on 1 counseling sessions, Psychoeducation, Medication administration, Evaluate responses to treatment, Monitor vital signs and CBGs as ordered, Perform/monitor CIWA, COWS, AIMS and Fall Risk screenings as ordered, Perform wound care treatments as ordered.  Evaluation of Outcomes: Not Progressing   LCSW Treatment Plan for Primary Diagnosis: Suicidal ideations Long Term Goal(s): Safe transition to appropriate next level of care at discharge, Engage patient in therapeutic group addressing interpersonal concerns.  Short Term Goals:  Engage patient in aftercare planning with referrals and resources, Increase social support, Increase ability to appropriately verbalize feelings, Increase emotional regulation, Facilitate acceptance of mental health diagnosis and concerns, Facilitate patient progression through stages of change regarding substance use diagnoses and concerns, Identify triggers associated with mental health/substance abuse issues, and Increase skills for wellness and recovery  Therapeutic Interventions: Assess for all discharge needs, 1 to 1 time with Social worker, Explore available resources and support systems, Assess for adequacy in community support network, Educate family and significant other(s) on suicide prevention, Complete Psychosocial Assessment, Interpersonal group therapy.  Evaluation of Outcomes: Not Progressing   Progress in Treatment: Attending groups: patient attended some groups Participating in groups:  N/A Taking medication as prescribed: Yes. Toleration medication: Yes. Family/Significant other contact made: patient declined consents Patient understands diagnosis: Yes. Discussing patient identified problems/goals with staff: Yes. Medical problems stabilized or resolved: Yes. Denies suicidal/homicidal ideation: Yes. Issues/concerns per patient self-inventory: No.  New problem(s) identified:  No  New Short Term/Long Term Goal(s):    medication stabilization, elimination of SI thoughts, development of comprehensive mental wellness plan.    Patient Goals:  I want to discontinue benzodiazepines.    Discharge Plan or Barriers:  Patient recently admitted. CSW will continue to follow and assess for appropriate referrals and possible discharge planning.    Reason for Continuation of Hospitalization: Depression Medication stabilization Suicidal ideation  Estimated Length of Stay:  5 - 7 days  Last 3 [place] Suicide Severity Risk Score: Flowsheet Row Admission (Current) [facility]  [facility] from 05/26/2024 in [facility] at [facility] ED from 03/28/2024 in [facility]  C-SSRS RISK CATEGORY Low Risk Low Risk No Risk    Last [facility] 2/9 Scores:     No data to display          Scribe for Treatment Team: Heiley Shaikh O Asar Evilsizer, LCSWA 05/29/2024 6:38 PM

## 2024-05-30 DIAGNOSIS — F410 Panic disorder [episodic paroxysmal anxiety] without agoraphobia: Secondary | ICD-10-CM | POA: Insufficient documentation

## 2024-05-30 MED ORDER — HYDROXYZINE HCL 25 MG PO TABS
25.0000 mg | ORAL_TABLET | Freq: Four times a day (QID) | ORAL | Status: AC | PRN
  Filled 2024-05-30 (×2): qty 1

## 2024-05-30 MED ORDER — LORAZEPAM 1 MG PO TABS: 1.0000 mg | ORAL_TABLET | Freq: Four times a day (QID) | ORAL | Status: AC | PRN

## 2024-05-30 MED ORDER — PROPRANOLOL HCL 10 MG PO TABS
10.0000 mg | ORAL_TABLET | Freq: Two times a day (BID) | ORAL | Status: DC
  Administered 2024-05-30 – 2024-06-02 (×6): 10 mg via ORAL
  Filled 2024-05-30 (×6): qty 1

## 2024-05-30 MED ORDER — LOPERAMIDE HCL 2 MG PO CAPS: 2.0000 mg | ORAL_CAPSULE | ORAL | Status: AC | PRN

## 2024-05-30 MED ORDER — IBUPROFEN 400 MG PO TABS
400.0000 mg | ORAL_TABLET | Freq: Four times a day (QID) | ORAL | Status: DC | PRN
  Administered 2024-05-30 – 2024-06-01 (×2): 400 mg via ORAL
  Filled 2024-05-30 (×2): qty 1

## 2024-05-30 MED ORDER — DESVENLAFAXINE SUCCINATE ER 50 MG PO TB24
50.0000 mg | ORAL_TABLET | Freq: Every day | ORAL | Status: DC
Start: 2024-05-31 — End: 2024-05-31
  Filled 2024-05-30: qty 1

## 2024-05-30 MED ORDER — ONDANSETRON 4 MG PO TBDP
4.0000 mg | ORAL_TABLET | Freq: Four times a day (QID) | ORAL | Status: AC | PRN
  Administered 2024-05-30: 4 mg via ORAL
  Filled 2024-05-30: qty 1

## 2024-05-30 MED ORDER — MUSCLE RUB 10-15 % EX CREA
TOPICAL_CREAM | CUTANEOUS | Status: DC | PRN
  Administered 2024-05-30: 1 via TOPICAL
  Filled 2024-05-30: qty 85

## 2024-05-30 NOTE — Progress Notes (Signed)
(  Sleep Hours) - 7.75 (Any PRNs that were needed, meds refused, or side effects to meds)- No PRN meds given, pt refused amitriptyline  25 mg.  (Any disturbances and when (visitation, over night)- None  (Concerns raised by the patient)- Pt states that they do not want to be on any medication; They keep trying to put me on medication, I came here to get off my benzos (SI/HI/AVH)- Denies SI/HI/AVH

## 2024-05-30 NOTE — Progress Notes (Signed)
   05/30/24 0943  Psych Admission Type (Psych Patients Only)  Admission Status Involuntary  Psychosocial Assessment  Patient Complaints Anxiety  Eye Contact Fair  Facial Expression Animated  Affect Anxious  Speech Logical/coherent  Interaction Assertive  Motor Activity Fidgety  Appearance/Hygiene Unremarkable  Behavior Characteristics Cooperative;Appropriate to situation  Mood Anxious;Preoccupied  Thought Process  Coherency WDL  Content WDL  Delusions None reported or observed  Perception WDL  Hallucination None reported or observed  Judgment Impaired  Confusion None  Danger to Self  Current suicidal ideation? Denies  Agreement Not to Harm Self Yes  Description of Agreement Verbal  Danger to Others  Danger to Others None reported or observed

## 2024-05-30 NOTE — Plan of Care (Signed)
  Problem: Activity: Goal: Interest or engagement in activities will improve Outcome: Progressing Goal: Sleeping patterns will improve Outcome: Progressing   Problem: Coping: Goal: Ability to verbalize frustrations and anger appropriately will improve Outcome: Progressing Goal: Ability to demonstrate self-control will improve Outcome: Progressing   Problem: Safety: Goal: Periods of time without injury will increase Outcome: Progressing

## 2024-05-30 NOTE — Progress Notes (Signed)
 [facility] MD Progress Note  05/30/2024 4:35 PM Diana Goodman  MRN:  [id number]  Principal Problem: Suicidal ideations Diagnosis: Principal Problem:   Suicidal ideations   Reason for Admission:  Diana Goodman is a 48 year old female with history of generalized anxiety disorder, benzodiazepine dependence, MDD presenting to behavioral health hospital primarily for intentionally swallowing 20 tablets of Valium  in the context of psychosocial stressors (admitted on 05/27/2024, total  LOS: 3 days ).   ASSESSMENT: Patient overall appears to be slowly improving.  She continues to motivated to be off all benzodiazepines.  She was amenable to starting propranolol  to aid with ongoing anxiety.  Denies history of asthma.  Had lengthy discussion regarding patient's anxiety as well as avoidance patterns encouraged patient to continue processing these as she moves forward.  She inquired about obtaining a therapist that would able to help with her history of trauma as well as grief.  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 On assessment today the patient reports feeling that her hospitalization has not been particularly helpful as she feels like she is struggling with addressing her anxiety.  She denies SI/HI/AVH.  She reports that her  children are a strong protective factor for her.  She reports eating and sleeping fairly well.  Continues to struggle with rumination and severe anxiety.  We discussed her patterns of rumination as well as avoidance.  We also discussed catastrophizing and all or nothing cognitive distortions.   Objective: Chart Review from last 24 hours:  The patient's chart was reviewed and nursing notes were reviewed. The patient's case was discussed in multidisciplinary team meeting.  - Overnight events to report per chart review / staff report: none - Patient took all prescribed medications refused elavil  and amitryptiline - Patient received the following PRNs: tylenol   Current Medications: Current Facility-Administered Medications  Medication Dose Route Frequency Provider Last Rate Last Admin   acetaminophen  (TYLENOL ) tablet 650 mg  650 mg Oral Q6H PRN Trudy Carwin, NP   650 mg at 05/30/24 0742   [START ON 05/31/2024] desvenlafaxine  (PRISTIQ ) 24 hr tablet 50 mg  50 mg Oral Daily Lynnette Barter, MD       feeding supplement (ENSURE PLUS HIGH PROTEIN) liquid 237 mL  237 mL Oral BID BM Bouchard, Marc A, DO   237 mL at 05/30/24 1632   hydrOXYzine  (ATARAX ) tablet 25 mg  25 mg Oral Q6H PRN Lynnette Barter, MD       ibuprofen  (ADVIL ) tablet 400 mg  400 mg Oral Q6H PRN Lynnette Barter, MD       loperamide  (IMODIUM ) capsule 2-4 mg  2-4 mg Oral PRN Lynnette Barter, MD       LORazepam  (ATIVAN ) tablet 1 mg  1 mg Oral Q6H PRN Lynnette Barter, MD       magnesium  hydroxide (MILK OF MAGNESIA) suspension 30 mL  30 mL Oral Daily PRN Trudy Carwin, NP       Muscle Rub CREA   Topical PRN Lynnette,  Prentice, MD       OLANZapine  (ZYPREXA ) injection 10 mg  10 mg Intramuscular TID PRN Trudy Carwin, NP       OLANZapine  (ZYPREXA ) injection 5 mg  5 mg Intramuscular TID PRN Trudy Carwin, NP       OLANZapine  zydis (ZYPREXA ) disintegrating tablet 5 mg  5 mg Oral TID PRN Trudy Carwin, NP       ondansetron  (ZOFRAN -ODT) disintegrating tablet 4 mg  4 mg Oral Q6H PRN  Lynnette Prentice, MD       propranolol  (INDERAL ) tablet 10 mg  10 mg Oral BID Jailynn Lavalais, MD   10 mg at 05/30/24 1632   Riboflavin -Magnesium -Feverfew 200-180-50 MG TABS 1 tablet  1 tablet Oral BID Kennyth Starleen RAMAN, MD   1 tablet at 05/30/24 0745    Lab Results:  Results for orders placed or performed during the hospital encounter of 05/27/24 (from the past 48 hours)  Folate     Status: None   Collection Time: 05/28/24  6:34 PM  Result Value Ref Range   Folate 6.6 >5.9 ng/mL    Comment: Performed at [facility], [address], [place], [place] [address]  Vitamin B12     Status: None   Collection Time: 05/28/24  6:34 PM  Result Value Ref Range   Vitamin B-12 572 180 - 914 pg/mL    Comment: Performed at [facility], [address], [place], [place] [address]  RPR     Status: None   Collection Time: 05/28/24  6:34 PM  Result Value Ref Range   RPR Ser Ql NON REACTIVE NON REACTIVE    Comment: Performed at [facility], [address], [place], [place] [address]  Hemoglobin A1c     Status: None   Collection Time: 05/28/24  6:34 PM  Result Value Ref Range   Hgb A1c MFr Bld 5.2 4.8 - 5.6 %    Comment: (NOTE)         Prediabetes: 5.7 - 6.4         Diabetes: >6.4         Glycemic control for adults with diabetes: <7.0    Mean Plasma Glucose 103 mg/dL    Comment: (NOTE) Performed At: [facility] [address] [place], [place] [id number] Jennette Shorter MD Ey:[id number]   HIV Antibody (routine testing w rflx)     Status: None   Collection Time: 05/28/24  6:34 PM  Result Value Ref Range   HIV Screen 4th Generation wRfx Non Reactive Non Reactive    Comment: Performed at [facility], [address] [place], [place] [address]    Blood Alcohol level:  Lab Results  Component Value Date   [facility] <15 05/26/2024   ETH 266 (H) 05/29/2021    Metabolic Labs: Lab Results  Component Value Date   HGBA1C 5.2 05/28/2024   MPG 103  05/28/2024   No results found for: PROLACTIN No results found for: CHOL, TRIG, HDL, CHOLHDL, VLDL, LDLCALC  Physical Findings: AIMS: No  CIWA:  CIWA-Ar Total: 1 COWS:     Psychiatric Specialty Exam: General Appearance: Appropriate for Environment; Casual; Fairly Groomed   Eye Contact: Good   Speech: Clear and Coherent; Normal Rate   Volume: Normal   Mood: Anxious (pleasant)   Affect: Appropriate   Thought Content: Logical   Suicidal Thoughts: Suicidal Thoughts: No   Homicidal Thoughts: Homicidal Thoughts: No   Thought Process: Coherent; Goal Directed  Orientation: Full (Time, Place and Person)     Memory: Immediate Good; Recent Good   Judgment: Fair   Insight: Fair   Concentration: Good   Recall: Fair   Fund of Knowledge: Fair   Language: Good   Psychomotor Activity: Psychomotor Activity: Normal   Assets: Communication Skills; Physical Health; Resilience   Sleep: Sleep: Good Number of Hours of Sleep: 8.25    Review of Systems ROS  Vital Signs: Blood pressure 129/89, pulse 96, temperature 97.9 F (36.6 C), temperature source Oral, resp. rate 12, height 5' 4 (1.626 m), weight 61.5 kg, SpO2 100%. Body mass index is 23.26 kg/m. Physical Exam  I certify that inpatient services furnished can reasonably be expected to improve the patient's condition.   Signed: Prentice Espy, MD 05/30/2024, 4:35 PM

## 2024-05-30 NOTE — Plan of Care (Signed)
   Problem: Education: Goal: Emotional status will improve Outcome: Progressing Goal: Mental status will improve Outcome: Progressing Goal: Verbalization of understanding the information provided will improve Outcome: Progressing   Problem: Activity: Goal: Interest or engagement in activities will improve Outcome: Progressing

## 2024-05-30 NOTE — Group Note (Signed)
 LCSW Group Therapy Note   Group Date: 05/30/2024 Start Time: 0100 End Time: 0200   Type of Therapy and Topic:  Group Therapy: Communication  Participation Level:  Did Not Attend  Description of Group: In this group patients will be encouraged to explore how individuals communicate with one another appropriately and in appropriately. The patients will be guided to discuss their thoughts, feelings and behaviors related to barriers communicating feelings, needs and stressors. The group will process together ways to execute positive and appropriate communications, with attention given to how one-use behaviors, tone and body languages to communicate. Each patient will be encouraged to identify specific changes that are authority, and parents. This group will be process-oriented, with patients participating in exploration of their own experiences as well as giving and receiving support and challenging self as well as other group members.   Therapeutic Goals:  1.  Patient will identify how people communicate (body language, facial expression and electronic) Also discuss tone, voice and how this impact what is communication and how they message is perceived.   2.  Patient will identify feelings (such as fear and worry) thought process and behaviors related to why people internalize feelings rather than express self openly 3.  Patient will identify two changes they are willing to make to overcome communication barriers 4.  Patient will demonstrate ability to communicate their needs and set boundaries  through discussion  Summary of Patient Progress:  NA  Therapeutic Modalities:   Cognitive Behavioral Therapy Solution-Focused Therapy  Mckenlee Mangham O Soma Bachand, LCSWA 05/30/2024  3:34 PM

## 2024-05-30 NOTE — Group Note (Signed)
 Date:  05/30/2024 Time:  9:20 PM  Group Topic/Focus:  Wrap-Up Group:   The focus of this group is to help patients review their daily goal of treatment and discuss progress on daily workbooks.    Participation Level:  Active  Participation Quality:  Appropriate  Affect:  Appropriate  Cognitive:  Appropriate  Insight: Improving  Engagement in Group:  Engaged  Modes of Intervention:  Discussion  Additional Comments:  Pt attended the evening wrap-up group. Tech introduced the staff for the evening, reminded group of the evening schedule and reminded them to ask for anything they need. Pt read and discussed a poem named Autobiography in [employer]. Pt shared how they connected with other pts perspectives.  Rutherford PARAS Chanan Detwiler 05/30/2024, 9:20 PM

## 2024-05-30 NOTE — Group Note (Signed)
 Date:  05/30/2024 Time:  4:06 PM  Group Topic/Focus:  Healthy vs Unhealthy Coping Strategies     Participation Level:  Active  Participation Quality:  Appropriate, Attentive, Sharing, and Supportive  Affect:  Appropriate  Cognitive:  Alert and Oriented  Insight: Appropriate  Engagement in Group:  Engaged and Supportive  Modes of Intervention:  Activity and Discussion  Additional Comments:    Lorelei Heikkila M Jehad Bisono 05/30/2024, 4:06 PM

## 2024-05-30 NOTE — Group Note (Signed)
 Date:  05/30/2024 Time:  9:36 AM  Group Topic/Focus:  Goals Group:   The focus of this group is to help patients establish daily goals to achieve during treatment and discuss how the patient can incorporate goal setting into their daily lives to aide in recovery.    Participation Level:  Active  Participation Quality:  Appropriate  Affect:  Appropriate  Cognitive:  Appropriate  Insight: Appropriate  Engagement in Group:  Improving  Modes of Intervention:  Discussion   Kimi Bordeau A Alma Mohiuddin 05/30/2024, 9:36 AM

## 2024-05-31 DIAGNOSIS — F410 Panic disorder [episodic paroxysmal anxiety] without agoraphobia: Secondary | ICD-10-CM

## 2024-05-31 DIAGNOSIS — R45851 Suicidal ideations: Secondary | ICD-10-CM

## 2024-05-31 DIAGNOSIS — F1320 Sedative, hypnotic or anxiolytic dependence, uncomplicated: Secondary | ICD-10-CM

## 2024-05-31 DIAGNOSIS — F411 Generalized anxiety disorder: Secondary | ICD-10-CM

## 2024-05-31 DIAGNOSIS — F332 Major depressive disorder, recurrent severe without psychotic features: Principal | ICD-10-CM

## 2024-05-31 DIAGNOSIS — F1390 Sedative, hypnotic, or anxiolytic use, unspecified, uncomplicated: Secondary | ICD-10-CM | POA: Insufficient documentation

## 2024-05-31 MED ORDER — CETIRIZINE HCL 10 MG PO TABS
10.0000 mg | ORAL_TABLET | Freq: Every day | ORAL | Status: DC | PRN
  Administered 2024-05-31: 10 mg via ORAL
  Filled 2024-05-31: qty 1

## 2024-05-31 MED ORDER — DESVENLAFAXINE SUCCINATE ER 50 MG PO TB24
50.0000 mg | ORAL_TABLET | Freq: Every day | ORAL | Status: DC
  Administered 2024-05-31 – 2024-06-01 (×2): 50 mg via ORAL
  Filled 2024-05-31 (×2): qty 1

## 2024-05-31 MED ORDER — MELATONIN 5 MG PO TABS
5.0000 mg | ORAL_TABLET | Freq: Every evening | ORAL | Status: DC | PRN
  Administered 2024-05-31 – 2024-06-01 (×3): 5 mg via ORAL
  Filled 2024-05-31 (×3): qty 1

## 2024-05-31 NOTE — Progress Notes (Signed)
   05/30/24 2133  Psych Admission Type (Psych Patients Only)  Admission Status Involuntary  Psychosocial Assessment  Patient Complaints Anxiety  Eye Contact Fair  Facial Expression Animated  Affect Anxious  Speech Logical/coherent  Interaction Assertive  Motor Activity Fidgety  Appearance/Hygiene Unremarkable  Behavior Characteristics Cooperative;Calm  Mood Anxious;Preoccupied;Pleasant  Thought Process  Coherency WDL  Content WDL  Delusions None reported or observed  Perception WDL  Hallucination None reported or observed  Judgment Impaired  Confusion None  Danger to Self  Current suicidal ideation? Denies  Agreement Not to Harm Self Yes  Description of Agreement Verbal  Danger to Others  Danger to Others None reported or observed

## 2024-05-31 NOTE — Progress Notes (Signed)
   05/31/24 0800  Psych Admission Type (Psych Patients Only)  Admission Status Involuntary  Psychosocial Assessment  Patient Complaints Anxiety  Eye Contact Fair  Facial Expression Animated  Affect Anxious  Speech Logical/coherent  Interaction Assertive  Motor Activity Fidgety  Appearance/Hygiene Unremarkable  Behavior Characteristics Cooperative;Anxious  Mood Anxious;Pleasant  Thought Process  Coherency WDL  Content WDL  Delusions None reported or observed  Perception WDL  Hallucination None reported or observed  Judgment Impaired  Confusion None  Danger to Self  Current suicidal ideation? Denies  Description of Suicide Plan No Plan  Agreement Not to Harm Self Yes  Description of Agreement Verbal  Danger to Others  Danger to Others None reported or observed

## 2024-05-31 NOTE — [facility] Group Notes (Signed)
 Adult Psychoeducational Group Note  Date:  05/31/2024 Time:  9:09 PM  Group Topic/Focus:  Wrap-Up Group:   The focus of this group is to help patients review their daily goal of treatment and discuss progress on daily workbooks.  Participation Level:  Active  Participation Quality:  Appropriate  Affect:  Appropriate  Cognitive:  Appropriate  Insight: Appropriate  Engagement in Group:  Engaged  Modes of Intervention:  Discussion  Additional Comments:  Kameryn said her day was a 10. The positive thing that happen today she talk to people about God.  Lang Drilling Long 05/31/2024, 9:09 PM

## 2024-05-31 NOTE — Plan of Care (Signed)
   Problem: Education: Goal: Emotional status will improve Outcome: Progressing Goal: Mental status will improve Outcome: Progressing Goal: Verbalization of understanding the information provided will improve Outcome: Progressing   Problem: Activity: Goal: Interest or engagement in activities will improve Outcome: Progressing

## 2024-05-31 NOTE — Progress Notes (Signed)
 [facility] MD Progress Note  05/31/2024 1:17 PM Diana Goodman  MRN:  [id number]  Principal Problem: Severe episode of recurrent major depressive disorder, without psychotic features (HCC) Diagnosis: Principal Problem:   Severe episode of recurrent major depressive disorder, without psychotic features (HCC) Active Problems:   Generalized anxiety disorder   Suicidal ideations   Panic disorder   Sedative, hypnotic or anxiolytic use disorder, moderate, in controlled environment   Reason for Admission:  Diana Goodman is a 48 year old female with history of generalized anxiety disorder, benzodiazepine dependence, MDD presenting to behavioral health hospital primarily for intentionally swallowing 20 tablets of Valium  in the context of psychosocial stressors (admitted on 05/27/2024, total  LOS: 4 days ).   ASSESSMENT: Patient overall appears to be slowly improving.  She continues to motivated to be off all benzodiazepines.  Tolerating propranolol  fairly well. Should she remain stable from withdrawal standpoint and anxiety fairly well managed, patient can likely discharge tuesday  PLAN: Generalized anxiety disorder with panic attacks Benzodiazepine use disorder Major depressive disorder-recurrent episode severe without psychotic features - Continue Pristiq  50 mg at bedtime  - Discontinued Topamax  as patient is refusing - Discontinued amitriptyline  as patient is refusing - Continue propranolol  10 mg twice daily -CIWA to monitor for benzodiazepine withdrawal  Disposition Planning: -- Estimated LOS: 5-7 days --Estimated Discharge Date 06/02/24 --Barriers to Discharge: ongoing anxiety and benzodiazepine withdrawal -- Discharge Goals: Return home with outpatient referrals for mental health follow-up including medication management/psychotherapy  Subjective: The patient was seen and evaluated on the unit. On assessment today patient reports poor sleep and requiring melatonin. Denies SI/HI/AVH. Feels  propranolol  is mildly helpful. Eating well. Inquires about switching pristiq  to nighttime which has been addressed.   Objective: Chart Review from last 24 hours:  The patient's chart was reviewed and nursing notes were reviewed. The patient's case was discussed in multidisciplinary team meeting.  - Overnight events to report per chart review / staff report: none - Patient took all prescribed medications refused elavil  and amitryptiline - Patient received the following PRNs: tylenol   Current Medications: Current Facility-Administered Medications  Medication Dose Route Frequency Provider Last Rate Last Admin   acetaminophen  (TYLENOL ) tablet 650 mg  650 mg Oral Q6H PRN Trudy Carwin, NP   650 mg at 05/30/24 9257   desvenlafaxine  (PRISTIQ ) 24 hr tablet 50 mg  50 mg Oral QHS Lynnette Barter, MD       feeding supplement (ENSURE PLUS HIGH PROTEIN) liquid 237 mL  237 mL Oral BID BM Bouchard, Marc A, DO   237 mL at 05/31/24 1034   hydrOXYzine  (ATARAX ) tablet 25 mg  25 mg Oral Q6H PRN Lynnette Barter, MD       ibuprofen  (ADVIL ) tablet 400 mg  400 mg Oral Q6H PRN Lynnette Barter, MD   400 mg at 05/30/24 1647   loperamide  (IMODIUM ) capsule 2-4 mg  2-4 mg Oral PRN Lynnette Barter, MD       LORazepam  (ATIVAN ) tablet 1 mg  1 mg Oral Q6H PRN Lynnette Barter, MD       magnesium  hydroxide (MILK OF MAGNESIA) suspension 30 mL  30 mL Oral Daily PRN Trudy Carwin, NP       melatonin tablet 5 mg  5 mg Oral QHS PRN Bobbitt, Shalon E, NP   5 mg at 05/31/24 0053   Muscle Rub CREA   Topical PRN Lynnette Barter, MD   1 Application at 05/30/24 1649   OLANZapine  (ZYPREXA ) injection 10 mg  10 mg Intramuscular TID  PRN Trudy Carwin, NP       OLANZapine  (ZYPREXA ) injection 5 mg  5 mg Intramuscular TID PRN Trudy Carwin, NP       OLANZapine  zydis (ZYPREXA ) disintegrating tablet 5 mg  5 mg Oral TID PRN Trudy Carwin, NP       ondansetron  (ZOFRAN -ODT) disintegrating tablet 4 mg  4 mg Oral Q6H PRN Lynnette Barter, MD   4 mg at 05/30/24 1648   propranolol   (INDERAL ) tablet 10 mg  10 mg Oral BID Aviana Shevlin, MD   10 mg at 05/31/24 0936   Riboflavin -Magnesium -Feverfew 200-180-50 MG TABS 1 tablet  1 tablet Oral BID Kennyth Starleen RAMAN, MD   1 tablet at 05/31/24 0935    Lab Results:  No results found for this or any previous visit (from the past 48 hours).   Blood Alcohol level:  Lab Results  Component Value Date   ETH <15 05/26/2024   ETH 266 (H) 05/29/2021    Metabolic Labs: Lab Results  Component Value Date   HGBA1C 5.2 05/28/2024   MPG 103 05/28/2024   No results found for: PROLACTIN No results found for: CHOL, TRIG, HDL, CHOLHDL, VLDL, LDLCALC  Physical Findings: AIMS: No  CIWA:  CIWA-Ar Total: 1 COWS:     Psychiatric Specialty Exam: General Appearance: Appropriate for Environment; Casual; Fairly Groomed   Eye Contact: Good   Speech: Clear and Coherent; Normal Rate   Volume: Normal   Mood: Anxious (pleasant)   Affect: Appropriate   Thought Content: Logical   Suicidal Thoughts: No data recorded   Homicidal Thoughts: No data recorded   Thought Process: Coherent; Goal Directed   Orientation: Full (Time, Place and Person)     Memory: Immediate Good; Recent Good   Judgment: Fair   Insight: Fair   Concentration: Good   Recall: Fair   Fund of Knowledge: Fair   Language: Good   Psychomotor Activity: No data recorded   Assets: Communication Skills; Physical Health; Resilience   Sleep: No data recorded    Review of Systems ROS  Vital Signs: Blood pressure 92/75, pulse 74, temperature 98.3 F (36.8 C), temperature source Oral, resp. rate (!) 24, height 5' 4 (1.626 m), weight 61.5 kg, SpO2 100%. Body mass index is 23.26 kg/m. Physical Exam  I certify that inpatient services furnished can reasonably be expected to improve the patient's condition.   Signed: Barter Lynnette, MD 05/31/2024, 1:17 PM

## 2024-05-31 NOTE — Plan of Care (Signed)
   Problem: Education: Goal: Emotional status will improve Outcome: Progressing   Problem: Activity: Goal: Interest or engagement in activities will improve Outcome: Progressing

## 2024-05-31 NOTE — Group Note (Signed)
 Date:  05/31/2024 Time:  6:42 PM  Group Topic/Focus:    The focus of this group is to introduce collage as a creative outlet for expressing emotions, reducing anxiety, fostering group cohesion and enabling personal insight.     Participation Level:  Did Not Attend  Berwyn GORMAN Acosta 05/31/2024, 6:42 PM

## 2024-05-31 NOTE — Plan of Care (Signed)
   Problem: Education: Goal: Knowledge of [facility] Education information/materials will improve Outcome: Progressing Goal: Emotional status will improve Outcome: Progressing Goal: Mental status will improve Outcome: Progressing

## 2024-05-31 NOTE — [facility] Group Notes (Signed)
 [facility] Group Notes:  (Nursing/MHT/Case Management/Adjunct)  Date:  05/31/2024  Time:  9:14 AM  Type of Therapy:  Group Topic/ Focus: Goals Group: The focus of this group is to help patients establish daily goals to achieve during treatment and discuss how the patient can incorporate goal setting into their daily lives to aide in recovery.   Participation Level:  Did Not Attend  Summary of Progress/Problems:  Patient did not attend goals group today. Patient was encouraged but refused.   Danette JONELLE Boos 05/31/2024, 9:14 AM

## 2024-05-31 NOTE — [facility] Group Notes (Signed)
 [facility] Group Notes:  (Nursing/MHT/Case Management/Adjunct)  Date:  05/31/2024  Time:  2:38 PM  Type of Therapy: Bingo  Participation Level:  Did Not Attend  Summary of Progress/Problems:  Patient did not attend or participate in Bingo. Patient was encouraged but refused.   Diana Goodman 05/31/2024, 2:38 PM

## 2024-05-31 NOTE — Progress Notes (Signed)
(  Sleep Hours) - 6.75 (Any PRNs that were needed, meds refused, or side effects to meds)- PRN melatonin 5 mg given at pt request, no meds refused.  (Any disturbances and when (visitation, over night)- None  (Concerns raised by the patient)- None  (SI/HI/AVH)- Denies SI/HI/AVH

## 2024-06-01 NOTE — Progress Notes (Signed)
(  Sleep Hours) -7.75 (Any PRNs that were needed, meds refused, or side effects to meds)-prn zyrtec  for itching @ 2140, and advil  and melatonin @ 0318  (Any disturbances and when (visitation, over night)-none (Concerns raised by the patient)- none (SI/HI/AVH)- Denies all

## 2024-06-01 NOTE — Group Note (Signed)
 Recreation Therapy Group Note   Group Topic:Leisure Education  Group Date: 06/01/2024 Start Time: 0935 End Time: 1015 Facilitators: Edrei Norgaard-McCall, LRT,CTRS Location: [address] Day[address]   Group Topic: Leisure Education   Goal Area(s) Addresses:  Patient will successfully identify positive leisure and recreation activities.  Patient will acknowledge benefits of participation in healthy leisure activities post discharge.  Patient will actively work with peers toward a shared goal.   Behavioral Response:    Intervention: Competitive Group Game   Activity: Guess the [employer]. Patients were to use the spinner to land on a category ([address], Hip Hop, Indie, Dance, R&B and Pop). Whatever category the patient landed on, LRT would read the lyric and patient had to guess the missing word from the lyric. It patient didn't guess the correct answer, everyone else got a chance to steal the point. Whoever guessed the correct answer, got the point. The person with the most cards at the end of the game was the winner.   Education:  Teacher, English as a foreign language, Stress Management, Discharge Planning  Education Outcome: Acknowledges education/In group clarification offered/Needs additional education   Affect/Mood: N/A   Participation Level: Did not attend    Clinical Observations/Individualized Feedback:      Plan: Continue to engage patient in RT group sessions 2-3x/week.   Taniqua Issa-McCall, LRT,CTRS 06/01/2024 12:40 PM

## 2024-06-01 NOTE — Group Note (Signed)
 Date:  06/01/2024 Time:  9:13 AM  Group Topic/Focus:  Goals Group:   The focus of this group is to help patients establish daily goals to achieve during treatment and discuss how the patient can incorporate goal setting into their daily lives to aide in recovery.    Participation Level:  Did Not Attend

## 2024-06-01 NOTE — Plan of Care (Signed)
   Problem: Education: Goal: Knowledge of [facility] Education information/materials will improve Outcome: Progressing Goal: Emotional status will improve Outcome: Progressing Goal: Mental status will improve Outcome: Progressing

## 2024-06-01 NOTE — Plan of Care (Signed)
?  Problem: Education: ?Goal: Emotional status will improve ?Outcome: Progressing ?Goal: Mental status will improve ?Outcome: Progressing ?  ?Problem: Health Behavior/Discharge Planning: ?Goal: Compliance with treatment plan for underlying cause of condition will improve ?Outcome: Progressing ?  ?Problem: Safety: ?Goal: Periods of time without injury will increase ?Outcome: Progressing ?  ?

## 2024-06-01 NOTE — Progress Notes (Signed)
 [facility] MD Progress Note  06/01/2024 12:38 PM Diana Goodman  MRN:  [id number]  Principal Problem: Severe episode of recurrent major depressive disorder, without psychotic features (HCC) Diagnosis: Principal Problem:   Severe episode of recurrent major depressive disorder, without psychotic features (HCC) Active Problems:   Generalized anxiety disorder   Suicidal ideations   Panic disorder   Sedative, hypnotic or anxiolytic use disorder, moderate, in controlled environment  Reason for Admission:  Diana Goodman is a 48 year old female with history of generalized anxiety disorder, benzodiazepine dependence, MDD presenting to behavioral health hospital primarily for intentionally swallowing 20 tablets of Valium  in the context of psychosocial stressors (admitted on 05/27/2024, total  LOS: 5 days ).  ASSESSMENT: Patient overall appears to be slowly improving.  She continues to motivated to be off all benzodiazepines.  Tolerating propranolol  fairly well. Should she remain stable from withdrawal standpoint and anxiety fairly well managed, patient can likely discharge Tuesday  PLAN: Generalized anxiety disorder with panic attacks Benzodiazepine use disorder Major depressive disorder-recurrent episode severe without psychotic features - Continue Pristiq  50 mg at bedtime  - Discontinued Topamax  as patient is refusing - Discontinued amitriptyline  as patient is refusing - Continue propranolol  10 mg twice daily -CIWA to monitor for benzodiazepine withdrawal: CIWA score of 0 today  Disposition Planning: -- Estimated LOS: 5-7 days --Estimated Discharge Date 06/02/24 --Barriers to Discharge: ongoing anxiety and benzodiazepine withdrawal -- Discharge Goals: Return home with outpatient referrals for mental health follow-up including medication management/psychotherapy  Today's assessment notes: On assessment today, the pt reports that their mood is euthymic, improved since admission, and stable. Denies  feeling down, depressed, or sad. Reports that anxiety symptoms are at manageable level. Patient reports poor sleep and requiring melatonin, although nursing staff report patient sleeping about 7.75 hours last night. Appetite is stable. Concentration is without complaint. Denies SI/HI/AVH. Feels propranolol  is mildly helpful.  Energy level is adequate. Denies having any suicidal thoughts. Denies having any suicidal intent and plan.  Denies having any HI.  Denies having psychotic symptoms.   Denies having side effects to current psychiatric medications.   Chart Review from last 24 hours:  The patient's chart was reviewed and nursing notes were reviewed. The patient's case was discussed in multidisciplinary team meeting.  - Overnight events to report per chart review / staff report: none - Patient took all prescribed medications refused elavil  and amitryptiline - Patient received the following PRNs: Melatonin x 1 for sleep, Zyrtec  x 1 for allergies, and p.o. x 1 for mild pain.  Current Medications: Current Facility-Administered Medications  Medication Dose Route Frequency Provider Last Rate Last Admin   acetaminophen  (TYLENOL ) tablet 650 mg  650 mg Oral Q6H PRN Trudy Carwin, NP   650 mg at 05/30/24 9257   cetirizine  (ZYRTEC ) tablet 10 mg  10 mg Oral Daily PRN Bobbitt, Shalon E, NP   10 mg at 05/31/24 2140   desvenlafaxine  (PRISTIQ ) 24 hr tablet 50 mg  50 mg Oral QHS Ji, Andrew, MD   50 mg at 05/31/24 2031   feeding supplement (ENSURE PLUS HIGH PROTEIN) liquid 237 mL  237 mL Oral BID BM BouchardOliva A, DO   237 mL at 06/01/24 1131   hydrOXYzine  (ATARAX ) tablet 25 mg  25 mg Oral Q6H PRN Lynnette Barter, MD       ibuprofen  (ADVIL ) tablet 400 mg  400 mg Oral Q6H PRN Lynnette Barter, MD   400 mg at 06/01/24 0318   loperamide  (IMODIUM ) capsule 2-4 mg  2-4 mg Oral PRN Ji, Andrew, MD       LORazepam  (ATIVAN ) tablet 1 mg  1 mg Oral Q6H PRN Lynnette Barter, MD       magnesium  hydroxide (MILK OF MAGNESIA) suspension  30 mL  30 mL Oral Daily PRN Trudy Carwin, NP       melatonin tablet 5 mg  5 mg Oral QHS PRN Bobbitt, Shalon E, NP   5 mg at 06/01/24 9681   Muscle Rub CREA   Topical PRN Lynnette Barter, MD   1 Application at 05/30/24 1649   OLANZapine  (ZYPREXA ) injection 10 mg  10 mg Intramuscular TID PRN Trudy Carwin, NP       OLANZapine  (ZYPREXA ) injection 5 mg  5 mg Intramuscular TID PRN Trudy Carwin, NP       OLANZapine  zydis (ZYPREXA ) disintegrating tablet 5 mg  5 mg Oral TID PRN Trudy Carwin, NP       ondansetron  (ZOFRAN -ODT) disintegrating tablet 4 mg  4 mg Oral Q6H PRN Lynnette Barter, MD   4 mg at 05/30/24 1648   propranolol  (INDERAL ) tablet 10 mg  10 mg Oral BID Ji, Andrew, MD   10 mg at 06/01/24 9187   Riboflavin -Magnesium -Feverfew 200-180-50 MG TABS 1 tablet  1 tablet Oral BID Kennyth Starleen RAMAN, MD   1 tablet at 06/01/24 9187   Lab Results:  No results found for this or any previous visit (from the past 48 hours).  Blood Alcohol level:  Lab Results  Component Value Date   ETH <15 05/26/2024   ETH 266 (H) 05/29/2021   Metabolic Labs: Lab Results  Component Value Date   HGBA1C 5.2 05/28/2024   MPG 103 05/28/2024   No results found for: PROLACTIN No results found for: CHOL, TRIG, HDL, CHOLHDL, VLDL, LDLCALC  Physical Findings: AIMS: No  CIWA:  CIWA-Ar Total: 0 COWS:     Psychiatric Specialty Exam: General Appearance: Appropriate for Environment; Casual   Eye Contact: Fair   Speech: Clear and Coherent   Volume: Normal   Mood: Irritable (Reports mood is irritable, due to not being able to sleep even with melatonin 5 mg.  However nursing staff report patient sleeping 7.75 hours.)   Affect: Congruent   Thought Content: Logical   Suicidal Thoughts: Suicidal Thoughts: No    Homicidal Thoughts: Homicidal Thoughts: No    Thought Process: Coherent; Linear   Orientation: Full (Time, Place and Person)     Memory: Immediate Good; Recent Good   Judgment: Fair    Insight: Fair   Concentration: Fair   Recall: Fair   Fund of Knowledge: Fair   Language: Good   Psychomotor Activity: Psychomotor Activity: Normal    Assets: Manufacturing systems engineer; Housing; Physical Health; Resilience   Sleep: Sleep: Good Number of Hours of Sleep: 7.75    Review of Systems Review of Systems  Constitutional:  Negative for chills and fever.  HENT:  Negative for sore throat.   Eyes:  Negative for blurred vision.  Respiratory:  Negative for cough, sputum production, shortness of breath and wheezing.   Cardiovascular:  Negative for chest pain and palpitations.  Gastrointestinal:  Negative for abdominal pain, constipation, diarrhea, heartburn, nausea and vomiting.  Musculoskeletal:  Negative for falls.  Skin:  Negative for itching and rash.  Neurological:  Negative for dizziness and headaches.  Endo/Heme/Allergies:        See allergy listing  Psychiatric/Behavioral:  Positive for depression. Negative for hallucinations, memory loss, substance abuse and suicidal ideas. The patient is  nervous/anxious. The patient does not have insomnia.    Vital Signs: Blood pressure 105/71, pulse 78, temperature 97.7 F (36.5 C), temperature source Oral, resp. rate (!) 24, height 5' 4 (1.626 m), weight 61.5 kg, SpO2 98%. Body mass index is 23.26 kg/m.  Physical Exam Vitals and nursing note reviewed.  Constitutional:      General: She is not in acute distress.    Appearance: She is normal weight. She is not ill-appearing.  HENT:     Right Ear: External ear normal.     Left Ear: External ear normal.  Cardiovascular:     Rate and Rhythm: Normal rate.     Pulses: Normal pulses.  Pulmonary:     Effort: Pulmonary effort is normal. No respiratory distress.  Abdominal:     Comments: Deferred  Genitourinary:    Comments: Deferred  Musculoskeletal:        General: Normal range of motion.     Cervical back: Normal range of motion.  Skin:    General: Skin is warm.   Neurological:     Mental Status: She is alert.  Psychiatric:        Behavior: Behavior normal.     Comments: Irritable    I certify that inpatient services furnished can reasonably be expected to improve the patient's condition.   Signed: Ellouise JAYSON Azure, FNP 06/01/2024, 12:38 PMPatient ID: Diana Goodman, female   DOB: [birth year], 48 y.o.   MRN: [id number]

## 2024-06-01 NOTE — Group Note (Signed)
 Date:  06/01/2024 Time:  4:36 PM  Group Topic/Focus: Transforming Your Thinking Managing Feelings:   The focus of this group is to identify what feelings patients have difficulty handling and develop a plan to handle them in a healthier way upon discharge.    Participation Level:  Did Not Attend  Participation Quality:    Affect:    Cognitive:    Insight:   Engagement in Group:    Modes of Intervention:    Additional Comments:    Skylan Lara 06/01/2024, 4:36 PM

## 2024-06-01 NOTE — Progress Notes (Signed)
   06/01/24 9187  Psych Admission Type (Psych Patients Only)  Admission Status Involuntary  Psychosocial Assessment  Patient Complaints None  Eye Contact Fair  Facial Expression Animated  Affect Anxious  Speech Logical/coherent  Interaction Assertive  Motor Activity Other (Comment) (WDL)  Appearance/Hygiene Unremarkable  Behavior Characteristics Cooperative;Appropriate to situation  Mood Pleasant  Thought Process  Coherency WDL  Content WDL  Delusions None reported or observed  Perception WDL  Hallucination None reported or observed  Judgment Poor  Confusion None  Danger to Self  Current suicidal ideation? Denies  Agreement Not to Harm Self Yes  Description of Agreement verbal  Danger to Others  Danger to Others None reported or observed

## 2024-06-02 DIAGNOSIS — F1520 Other stimulant dependence, uncomplicated: Secondary | ICD-10-CM

## 2024-06-02 DIAGNOSIS — F441 Dissociative fugue: Secondary | ICD-10-CM

## 2024-06-02 MED ORDER — IBUPROFEN 400 MG PO TABS: 400.0000 mg | ORAL_TABLET | Freq: Four times a day (QID) | ORAL | 0 refills | Status: AC | PRN

## 2024-06-02 MED ORDER — PROPRANOLOL HCL 10 MG PO TABS: 10.0000 mg | ORAL_TABLET | Freq: Two times a day (BID) | ORAL | 0 refills | Status: AC

## 2024-06-02 MED ORDER — MUSCLE RUB 10-15 % EX CREA: 1.0000 | TOPICAL_CREAM | CUTANEOUS | 0 refills | Status: AC | PRN

## 2024-06-02 MED ORDER — DESVENLAFAXINE SUCCINATE ER 50 MG PO TB24: 50.0000 mg | ORAL_TABLET | Freq: Every day | ORAL | 3 refills | Status: AC

## 2024-06-02 MED ORDER — CETIRIZINE HCL 10 MG PO TABS: 10.0000 mg | ORAL_TABLET | Freq: Every day | ORAL | 0 refills | Status: AC

## 2024-06-02 MED ORDER — MELATONIN 5 MG PO TABS: 5.0000 mg | ORAL_TABLET | Freq: Every evening | ORAL | 0 refills | Status: AC | PRN

## 2024-06-02 NOTE — Progress Notes (Signed)
   06/02/24 0800  Psych Admission Type (Psych Patients Only)  Admission Status Involuntary  Psychosocial Assessment  Patient Complaints None  Eye Contact Fair  Facial Expression Animated  Affect Anxious  Speech Logical/coherent  Interaction Assertive  Motor Activity Other (Comment)  Appearance/Hygiene Unremarkable  Behavior Characteristics Appropriate to situation  Mood Pleasant  Thought Process  Coherency WDL  Content WDL  Delusions None reported or observed  Perception WDL  Hallucination None reported or observed  Judgment Poor  Confusion None  Danger to Self  Current suicidal ideation? Denies  Description of Suicide Plan No Plan  Agreement Not to Harm Self Yes  Description of Agreement Verbal  Danger to Others  Danger to Others None reported or observed

## 2024-06-02 NOTE — Progress Notes (Signed)
 Pt discharged to lobby. Pt was stable and appreciative at that time. All papers and prescriptions were given and valuables returned. Verbal understanding expressed. Denies SI/HI and A/VH. Pt given opportunity to express concerns and ask questions.

## 2024-06-02 NOTE — Group Note (Signed)
 Date:  06/02/2024 Time:  9:11 AM  Group Topic/Focus:  Goals Group:   The focus of this group is to help patients establish daily goals to achieve during treatment and discuss how the patient can incorporate goal setting into their daily lives to aide in recovery.    Participation Level:  Active  Participation Quality:  Appropriate  Affect:  Appropriate  Cognitive:  Appropriate  Insight: Appropriate  Engagement in Group:  Engaged  Modes of Intervention:  Discussion  Additional Comments:  pt stated she is being discharged  Nat Rummer 06/02/2024, 9:11 AM

## 2024-06-02 NOTE — Progress Notes (Signed)
(  Sleep Hours) -7.5 (Any PRNs that were needed, meds refused, or side effects to meds)- prn melatonin @ 2125 (Any disturbances and when (visitation, over night)-none (Concerns raised by the patient)-none  (SI/HI/AVH)- Denies all

## 2024-06-02 NOTE — Discharge Summary (Signed)
 Physician Discharge Summary Note  Patient:  Diana Goodman is an 48 y.o., female MRN:  [id number] DOB:  09/11/1976 Patient phone:  [phone] (home)  Patient address:   [address] [place] [address],   Total Time spent with patient: 45 minutes  Date of Admission:  05/27/2024 Date of Discharge:   06/02/2024  Reason for Admission:  Diana Goodman is a 48 year old Caucasian female with prior psychiatric diagnoses significant for MDD recurrent, severe, without psychotic features, suicide ideations, prior suicide attempt with overdosing requiring hospitalization, and GAD.  Patient presents involuntarily to [facility] for worsening depression with increased anxiety, resulting in intentionally swallowing of 20, 5 mg Valium  tablets in the context of psychosocial stressors.  BAL less than 15, UDS positive for benzodiazepine.   Principal Problem: Severe episode of recurrent major depressive disorder, without psychotic features (HCC) Discharge Diagnoses: Principal Problem:   Severe episode of recurrent major depressive disorder, without psychotic features (HCC) Active Problems:   Generalized anxiety disorder   Suicidal ideations   Panic disorder   Sedative, hypnotic or anxiolytic use disorder, moderate, in controlled environment  Past Psychiatric History: Previous Psych Diagnoses: MDD severe, recurrent without psychotic features, SI, GAD. Prior inpatient treatment: Yes x 2 in 2013 Current/prior outpatient treatment: Yes Prior rehab hx: Denies Psychotherapy hx: Yes History of suicide: Yes x 3 and was hospitalized History of homicide or aggression: Denies Psychiatric medication history: Patient has been on trial of Valium , Pristiq , Elavil  Psychiatric medication compliance history: Noncompliance Neuromodulation history: Denies Current Psychiatrist: Dr. Claudene, patient refers to him as an IDIOT Current therapist: Yes Randine Arabia  Past Medical History:  Past  Medical History:  Diagnosis Date   ADHD    Anxiety    Depression    GAD (generalized anxiety disorder)    Headache    IC (interstitial cystitis)    OCD (obsessive compulsive disorder)    Vitamin D deficiency     Past Surgical History:  Procedure Laterality Date   CHOLECYSTECTOMY N/A 09/12/2018   Procedure: LAPAROSCOPIC CHOLECYSTECTOMY WITH INTRAOPERATIVE CHOLANGIOGRAM ERAS PATHWAY;  Surgeon: Curvin Deward MOULD, MD;  Location: [facility] OR;  Service: General;  Laterality: N/A;   Family History:  Family History  Problem Relation Age of Onset   Depression Mother    Anxiety disorder Mother    Migraines Mother    Depression Father    Anxiety disorder Father    Breast cancer Paternal Aunt    Depression Brother    Anxiety disorder Brother    Family Psychiatric  History: See H&P Social History:  Social History   Substance and Sexual Activity  Alcohol Use Yes     Social History   Substance and Sexual Activity  Drug Use No    Social History   Socioeconomic History   Marital status: Widowed    Spouse name: Not on file   Number of children: 2   Years of education: Not on file   Highest education level: Some college, no degree  Occupational History   Not on file  Tobacco Use   Smoking status: Never   Smokeless tobacco: Never  Substance and Sexual Activity   Alcohol use: Yes   Drug use: No   Sexual activity: Not on file  Other Topics Concern   Not on file  Social History Narrative   Patient is right-handed. She rarely drinks caffeine. She does not regularly exercise.   Social Drivers of Corporate investment banker Strain: Not on  file  Food Insecurity: No Food Insecurity (05/27/2024)   Hunger Vital Sign    Worried About Running Out of Food in the Last Year: Never true    Ran Out of Food in the Last Year: Never true  Transportation Needs: No Transportation Needs (05/27/2024)   PRAPARE - Administrator, Civil Service (Medical): No    Lack of Transportation  (Non-Medical): No  Physical Activity: Not on file  Stress: Not on file  Social Connections: Not on file   Hospital Course:  During the patient's hospitalization, patient had extensive initial psychiatric evaluation, and follow-up psychiatric evaluations every day.  Psychiatric diagnoses provided upon initial assessment:  Diagnosis:  Principal Problem:   Suicidal ideations  Generalized Anxiety Disorder R/O Mood Disorder vs. Recurrent Depression   Patient's psychiatric medications were adjusted on admission:  -Continue amitriptyline  tablet 25 mg p.o. daily at bedtime --Continue Pristiq  24-hour tablet 50 mg p.o. daily for depression --Continue Valium  5 mg p.o. every 12 hours x 2 doses only -- Continue Valium  5 mg X 1 dose on 05/29/2024, then stop  During the hospitalization, other adjustments were made to the patient's psychiatric medication regimen:  Amitriptyline  was discontinued per patient request Valium  was discontinued per patient request  Patient's care was discussed during the interdisciplinary team meeting every day during the hospitalization.  The patient denies having side effects to prescribed psychiatric medication.  Gradually, patient started adjusting to milieu. The patient was evaluated each day by a clinical provider to ascertain response to treatment. Improvement was noted by the patient's report of decreasing symptoms, improved sleep and appetite, affect, medication tolerance, behavior, and participation in unit programming.  Patient was asked each day to complete a self inventory noting mood, mental status, pain, new symptoms, anxiety and concerns.    Symptoms were reported as significantly decreased or resolved completely by discharge.   On day of discharge, the patient reports that their mood is stable. The patient denied having suicidal thoughts for more than 48 hours prior to discharge.  Patient denies having homicidal thoughts.  Patient denies having auditory  hallucinations.  Patient denies any visual hallucinations or other symptoms of psychosis. The patient was motivated to continue taking medication with a goal of continued improvement in mental health.   The patient reports their target psychiatric symptoms of depression responded well to the psychiatric medications, and the patient reports overall benefit other psychiatric hospitalization. Supportive psychotherapy was provided to the patient. The patient also participated in regular group therapy while hospitalized. Coping skills, problem solving as well as relaxation therapies were also part of the unit programming.  Labs were reviewed with the patient, and abnormal results were discussed with the patient.  The patient is able to verbalize their individual safety plan to this provider.  # It is recommended to the patient to continue psychiatric medications as prescribed, after discharge from the hospital.    # It is recommended to the patient to follow up with your outpatient psychiatric provider and PCP.  # It was discussed with the patient, the impact of alcohol, drugs, tobacco have been there overall psychiatric and medical wellbeing, and total abstinence from substance use was recommended the patient.ed.  # Prescriptions provided or sent directly to preferred pharmacy at discharge. Patient agreeable to plan. Given opportunity to ask questions. Appears to feel comfortable with discharge.    # In the event of worsening symptoms, the patient is instructed to call the crisis hotline, 911 and or go to the  nearest ED for appropriate evaluation and treatment of symptoms. To follow-up with primary care provider for other medical issues, concerns and or health care needs  # Patient was discharged to home with a plan to follow up as noted below.   Physical Findings: AIMS:  , ,  ,  ,  ,  ,   CIWA:  CIWA-Ar Total: 0 COWS:     Musculoskeletal: Strength & Muscle Tone: within normal limits Gait &  Station: normal Patient leans: N/A  Psychiatric Specialty Exam:  Presentation  General Appearance:  Appropriate for Environment; Casual; Fairly Groomed  Eye Contact: Fair  Speech: Clear and Coherent  Speech Volume: Normal  Handedness: Right  Mood and Affect  Mood: Euthymic  Affect: Appropriate; Congruent  Thought Process  Thought Processes: Coherent; Goal Directed  Descriptions of Associations:Intact  Orientation:Full (Time, Place and Person)  Thought Content:Logical  History of Schizophrenia/Schizoaffective disorder:No  Duration of Psychotic Symptoms:N/A  Hallucinations:Hallucinations: None  Ideas of Reference:None  Suicidal Thoughts:Suicidal Thoughts: No  Homicidal Thoughts:Homicidal Thoughts: No  Sensorium  Memory: Immediate Good; Recent Good  Judgment: Fair  Insight: Fair  Art therapist  Concentration: Good  Attention Span: Good  Recall: Fair  Fund of Knowledge: Fair  Language: Good  Psychomotor Activity  Psychomotor Activity: Psychomotor Activity: Normal  Assets  Assets: Communication Skills; Housing; Physical Health; Resilience  Sleep  Sleep: Sleep: Good  Estimated Sleeping Duration (Last 24 Hours): 7.00-7.50 hours  Physical Exam: Physical Exam Vitals reviewed.  Constitutional:      General: She is not in acute distress.    Appearance: Normal appearance. She is normal weight. She is not ill-appearing.  HENT:     Head: Normocephalic and atraumatic.     Right Ear: External ear normal.     Left Ear: External ear normal.     Nose: Nose normal.     Mouth/Throat:     Mouth: Mucous membranes are moist.     Pharynx: Oropharynx is clear.  Eyes:     Extraocular Movements: Extraocular movements intact.  Cardiovascular:     Rate and Rhythm: Normal rate.     Pulses: Normal pulses.  Pulmonary:     Effort: Pulmonary effort is normal. No respiratory distress.  Abdominal:     Comments: Deferred    Genitourinary:    Comments: Deferred  Musculoskeletal:        General: Normal range of motion.     Cervical back: Normal range of motion.  Skin:    General: Skin is warm.  Neurological:     General: No focal deficit present.     Mental Status: She is alert and oriented to person, place, and time.  Psychiatric:        Mood and Affect: Mood normal.        Behavior: Behavior normal.    Review of Systems  Constitutional:  Negative for chills and fever.  HENT:  Negative for sore throat.   Eyes:  Negative for blurred vision.  Respiratory:  Negative for cough, sputum production, shortness of breath and wheezing.   Cardiovascular:  Negative for chest pain and palpitations.  Gastrointestinal:  Negative for abdominal pain, constipation, diarrhea, heartburn, nausea and vomiting.  Genitourinary:  Negative for dysuria and urgency.  Musculoskeletal:  Negative for falls.  Skin:  Negative for itching and rash.  Neurological:  Negative for dizziness and headaches.  Endo/Heme/Allergies:        See allergy listing  Psychiatric/Behavioral:  Positive for depression (Stable with medication). Negative for  hallucinations, substance abuse and suicidal ideas. The patient is nervous/anxious (Improved with medication). The patient does not have insomnia.    Blood pressure 109/73, pulse 76, temperature 98.2 F (36.8 C), temperature source Oral, resp. rate 18, height 5' 4 (1.626 m), weight 61.5 kg, SpO2 100%. Body mass index is 23.26 kg/m.  Social History   Tobacco Use  Smoking Status Never  Smokeless Tobacco Never   Tobacco Cessation:  N/A, patient does not currently use tobacco products Blood Alcohol level:  Lab Results  Component Value Date   ETH <15 05/26/2024   ETH 266 (H) 05/29/2021   Metabolic Disorder Labs:  Lab Results  Component Value Date   HGBA1C 5.2 05/28/2024   MPG 103 05/28/2024   No results found for: PROLACTIN No results found for: CHOL, TRIG, HDL, CHOLHDL,  VLDL, LDLCALC  See Psychiatric Specialty Exam and Suicide Risk Assessment completed by Attending Physician prior to discharge.  Discharge destination:  Home  Is patient on multiple antipsychotic therapies at discharge:  No   Has Patient had three or more failed trials of antipsychotic monotherapy by history:  No  Recommended Plan for Multiple Antipsychotic Therapies: NA  Discharge Instructions     Increase activity slowly   Complete by: As directed       Allergies as of 06/02/2024       Reactions   Benadryl  [diphenhydramine ] Other (See Comments)   -restless leg   Penicillins Nausea And Vomiting   Has patient had a PCN reaction causing immediate rash, facial/tongue/throat swelling, SOB or lightheadedness with hypotension: No Has patient had a PCN reaction causing severe rash involving mucus membranes or skin necrosis: No Has patient had a PCN reaction that required hospitalization: No Has patient had a PCN reaction occurring within the last 10 years: Unknown If all of the above answers are NO, then may proceed with Cephalosporin use.        Medication List     STOP taking these medications    amitriptyline  50 MG tablet Commonly known as: ELAVIL    clonazePAM  1 MG tablet Commonly known as: KLONOPIN    diazepam  5 MG tablet Commonly known as: VALIUM    topiramate  50 MG tablet Commonly known as: TOPAMAX        TAKE these medications      Indication  cetirizine  10 MG tablet Commonly known as: ZYRTEC  Take 1 tablet (10 mg total) by mouth daily.  Indication: Hayfever   desvenlafaxine  50 MG 24 hr tablet Commonly known as: PRISTIQ  Take 1 tablet (50 mg total) by mouth at bedtime. What changed: when to take this  Indication: Major Depressive Disorder   ibuprofen  400 MG tablet Commonly known as: ADVIL  Take 1 tablet (400 mg total) by mouth every 6 (six) hours as needed for moderate pain (pain score 4-6).  Indication: Pain   MAGNESIUM  GLYCINATE PO Take 1  tablet by mouth at bedtime.  Indication: Migraine Headach   melatonin 5 MG Tabs Take 1 tablet (5 mg total) by mouth at bedtime as needed.  Indication: Trouble Sleeping   Muscle Rub 10-15 % Crea Apply 1 Application topically as needed for muscle pain.  Indication: Muscle Pain   propranolol  10 MG tablet Commonly known as: INDERAL  Take 1 tablet (10 mg total) by mouth 2 (two) times daily.  Indication: High Blood Pressure        Follow-up Information     [facility] Follow up on 06/05/2024.   Specialty: Professional Counselor Why: Please call your provider on  06/05/24 at 9:00 am to schedule your next therapy appointment, as we were unable to contact prior to your discharge. Contact information: [address] [place] [address] [phone]         [facility]. Go on 06/18/2024.   Why: You are already scheduled for an appointment with Dr. Claudene for medication management services on 06/18/24 at 3:00 pm, in person. Contact information: [address] [place], [place], [address]                Follow-up recommendations:   Discharge Recommendations:    The patient is being discharged with her family.   Patient is to take her discharge medications as ordered. ?See follow up above.   We recommend that she participates in individual therapy to target uncontrollable agitation and substance abuse.    We recommend that she participates in therapy to target the conflict with her family, to improve communication skills and conflict resolution skills. ?patient is to initiate/implement a contingency based behavioral model to address patient's behavior.   We recommend that she gets AIMS scale, height, weight, blood pressure, fasting lipid panel, fasting blood sugar in three months from discharge if she's on atypical antipsychotics.    Patient will benefit from monitoring of recurrent suicidal ideation since patient is on antidepressant  medication.   The patient should abstain from all illicit substances and alcohol.   If the patient's symptoms worsen or do not continue to improve or if the patient becomes actively suicidal or homicidal then it is recommended that the patient return to the closest hospital emergency room or call 911 for further evaluation and treatment. National Suicide Prevention Lifeline 1800-SUICIDE or [phone].   Please follow up with your primary medical doctor for all other medical needs.    The patient has been educated on the possible side effects to medications and she/her guardian is to contact a medical professional and inform outpatient provider of any new side effects of medication.   She is to take regular diet and activity as tolerated. ?Will benefit from moderate daily exercise.   Patient was educated about removing/locking any firearms, medications or dangerous products from the home.   Activity:  As tolerated   Diet:  Regular Diet  Signed:  Ellouise JAYSON Azure, FNP 06/02/2024, 9:54 AM

## 2024-06-02 NOTE — [facility] Suicide Risk Assessment (Signed)
 Suicide Risk Assessment  Discharge Assessment    [facility] Discharge Suicide Risk Assessment   Principal Problem: Severe episode of recurrent major depressive disorder, without psychotic features (HCC) Discharge Diagnoses: Principal Problem:   Severe episode of recurrent major depressive disorder, without psychotic features (HCC) Active Problems:   Generalized anxiety disorder   Suicidal ideations   Panic disorder   Sedative, hypnotic or anxiolytic use disorder, moderate, in controlled environment  Total Time spent with patient: 45 minutes  Musculoskeletal: Strength & Muscle Tone: within normal limits Gait & Station: normal Patient leans: N/A  Psychiatric Specialty Exam  Presentation  General Appearance:  Appropriate for Environment; Casual  Eye Contact: Fair  Speech: Clear and Coherent  Speech Volume: Normal  Handedness: Right  Mood and Affect  Mood: Irritable (Reports mood is irritable, due to not being able to sleep even with melatonin 5 mg.  However nursing staff report patient sleeping 7.75 hours.)  Duration of Depression Symptoms: No data recorded Affect: Congruent  Thought Process  Thought Processes: Coherent; Linear  Descriptions of Associations:Intact  Orientation:Full (Time, Place and Person)  Thought Content:Logical  History of Schizophrenia/Schizoaffective disorder:No  Duration of Psychotic Symptoms:N/A  Hallucinations:Hallucinations: None  Ideas of Reference:None  Suicidal Thoughts:Suicidal Thoughts: No  Homicidal Thoughts:Homicidal Thoughts: No  Sensorium  Memory: Immediate Good; Recent Good  Judgment: Fair  Insight: Fair  Art therapist  Concentration: Fair  Attention Span: Good  Recall: Fair  Fund of Knowledge: Fair  Language: Good  Psychomotor Activity  Psychomotor Activity: Psychomotor Activity: Normal  Assets  Assets: Communication Skills; Housing; Physical Health; Resilience  Sleep   Sleep: Sleep: Good  Estimated Sleeping Duration (Last 24 Hours): 7.00-7.50 hours  Physical Exam: Physical Exam Vitals and nursing note reviewed.  Constitutional:      General: She is not in acute distress.    Appearance: She is normal weight. She is not ill-appearing.  HENT:     Head: Normocephalic.     Right Ear: External ear normal.     Left Ear: External ear normal.     Nose: Nose normal.     Mouth/Throat:     Mouth: Mucous membranes are moist.  Eyes:     Extraocular Movements: Extraocular movements intact.     Pupils: Pupils are equal, round, and reactive to light.  Cardiovascular:     Rate and Rhythm: Normal rate.     Pulses: Normal pulses.  Pulmonary:     Effort: Pulmonary effort is normal.  Abdominal:     Comments: Deferred  Genitourinary:    Comments: Deferred  Musculoskeletal:        General: Normal range of motion.     Cervical back: Normal range of motion.  Skin:    General: Skin is warm.  Neurological:     General: No focal deficit present.     Mental Status: She is alert and oriented to person, place, and time.  Psychiatric:        Mood and Affect: Mood normal.        Behavior: Behavior normal.        Thought Content: Thought content normal.    Review of Systems  Constitutional:  Negative for chills and fever.  HENT:  Negative for sore throat.   Eyes:  Negative for blurred vision.  Respiratory:  Negative for cough, sputum production, shortness of breath and wheezing.   Cardiovascular:  Negative for chest pain and palpitations.  Gastrointestinal:  Negative for abdominal pain, constipation, diarrhea, heartburn, nausea and vomiting.  Genitourinary:  Negative for dysuria.  Musculoskeletal:  Negative for falls.  Skin:  Negative for itching and rash.  Neurological:  Negative for dizziness and headaches.  Endo/Heme/Allergies:        See allergy listing  Psychiatric/Behavioral:  Positive for depression (Stable with medications). Negative for  hallucinations, substance abuse and suicidal ideas. The patient is nervous/anxious (Improved with medication). The patient does not have insomnia.    Blood pressure 109/73, pulse 76, temperature 98.2 F (36.8 C), temperature source Oral, resp. rate 18, height 5' 4 (1.626 m), weight 61.5 kg, SpO2 100%. Body mass index is 23.26 kg/m.  Mental Status Per Nursing Assessment::   On Admission:  Suicidal ideation indicated by others  Demographic Factors:  Adolescent or young adult, Caucasian, Low socioeconomic status, Living alone, and Unemployed  Loss Factors: Financial problems/change in socioeconomic status  Historical Factors: Prior suicide attempts, Family history of suicide, Family history of mental illness or substance abuse, and Impulsivity  Risk Reduction Factors:   Responsible for children under 37 years of age, Sense of responsibility to family, Positive social support, Positive therapeutic relationship, and Positive coping skills or problem solving skills  Continued Clinical Symptoms:  Depression:   Impulsivity Recent sense of peace/wellbeing More than one psychiatric diagnosis  Cognitive Features That Contribute To Risk:  Polarized thinking    Suicide Risk:  Mild:  Suicidal ideation of limited frequency, intensity, duration, and specificity.  There are no identifiable plans, no associated intent, mild dysphoria and related symptoms, good self-control (both objective and subjective assessment), few other risk factors, and identifiable protective factors, including available and accessible social support.   Follow-up Information     [facility] Follow up on 06/05/2024.   Specialty: Professional Counselor Why: Please call your provider on 06/05/24 at 9:00 am to schedule your next therapy appointment, as we were unable to contact prior to your discharge. Contact information: [address] [place] [address] [phone]         [facility] at  [place]. Go on 06/18/2024.   Why: You are already scheduled for an appointment with Dr. Claudene for medication management services on 06/18/24 at 3:00 pm, in person. Contact information: [address], [place], [place], [address]               Plan Of Care/Follow-up recommendations:  Discharge Recommendations:    The patient is being discharged with her family.   Patient is to take her discharge medications as ordered. ?See follow up above.   We recommend that she participates in individual therapy to target uncontrollable agitation and substance abuse.    We recommend that she participates in therapy to target the conflict with her family, to improve communication skills and conflict resolution skills. ?patient is to initiate/implement a contingency based behavioral model to address patient's behavior.   We recommend that she gets AIMS scale, height, weight, blood pressure, fasting lipid panel, fasting blood sugar in three months from discharge if she's on atypical antipsychotics.    Patient will benefit from monitoring of recurrent suicidal ideation since patient is on antidepressant medication.   The patient should abstain from all illicit substances and alcohol.   If the patient's symptoms worsen or do not continue to improve or if the patient becomes actively suicidal or homicidal then it is recommended that the patient return to the closest hospital emergency room or call 911 for further evaluation and treatment. National Suicide Prevention Lifeline 1800-SUICIDE or [phone].  Please follow up with your primary medical doctor for all other medical needs.    The patient has been educated on the possible side effects to medications and she/her guardian is to contact a medical professional and inform outpatient provider of any new side effects of medication.   She is to take regular diet and activity as tolerated. ?Will benefit from moderate daily exercise.   Patient  was educated about removing/locking any firearms, medications or dangerous products from the home.   Activity:  As tolerated   Diet:  Regular Diet   Ellouise JAYSON Azure, FNP 06/02/2024, 9:38 AM

## 2024-06-02 NOTE — Progress Notes (Signed)
  [facility] Adult Case Management Discharge Plan :  Will you be returning to the same living situation after discharge:  [address] [place]  At discharge, do you have transportation home?: No. Do you have the ability to pay for your medications: Yes,  The patient  stated insurance  Release of information consent forms completed and in the chart;  Patient's signature needed at discharge.  Patient to Follow up at:  Follow-up Information     [facility] Follow up on 06/05/2024.   Specialty: Professional Counselor Why: Please call your provider on 06/05/24 at 9:00 am to schedule your next therapy appointment, as we were unable to contact prior to your discharge. Contact information: [address] [place] [address] [phone]         [facility] [place]. Go on 06/18/2024.   Why: You are already scheduled for an appointment with Dr. Claudene for medication management services on 06/18/24 at 3:00 pm, in person. Contact information: [address] [place], [place], [address]                Next level of care provider has access to [facility] Link:yes  Safety Planning and Suicide Prevention discussed: Yes,  Discussed with the patient     Has patient been referred to the Quitline?: Patient does not use tobacco/nicotine products  Patient has been referred for addiction treatment: Yes, the patient will follow up with an outpatient provider for substance use disorder. Psychiatrist/APP: appointment made and Therapist: appointment made  Roselyn GORMAN Lento, LCSW 06/02/2024, 9:20 AM

## 2024-06-02 NOTE — Transportation (Signed)
 06/02/2024  Diana Goodman DOB: Feb 06, 1976 MRN: [id number]   RIDER WAIVER AND RELEASE OF LIABILITY  For the purposes of helping with transportation needs, [facility] partners with outside transportation providers (taxi companies, [place], Catering manager.) to give Diana Goodman patients or other approved people the choice of on-demand rides Public librarian) to our buildings for non-emergency visits.  By using [employer], I, the person signing this document, on behalf of myself and/or any legal minors (in my care using the [employer]), agree:  Science writer given to me are supplied by independent, outside transportation providers who do not work for, or have any affiliation with, [employer]. [place] is not a transportation company. [facility] has no control over the quality or safety of the rides I get using [employer]. [place] has no control over whether any outside ride will happen on time or not. [facility] gives no guarantee on the reliability, quality, safety, or availability on any rides, or that no mistakes will happen. I know and accept that traveling by vehicle (car, truck, SVU, fleeta, bus, taxi, etc.) has risks of serious injuries such as disability, being paralyzed, and death. I know and agree the risk of using [employer] is mine alone, and not [employer]. [employer] are provided as is and as are available. The transportation providers are in charge for all inspections and care of the vehicles used to provide these rides. I agree not to take legal action against [place], its agents, employees, officers, directors, representatives, insurers, attorneys, assigns, successors, subsidiaries, and affiliates at any time for any reasons related directly or indirectly to using [employer]. I also agree not to take legal action against [place] or its affiliates for any injury, death, or damage to property caused by or related to using  [employer]. I have read this Waiver and Release of Liability, and I understand the terms used in it and their legal meaning. This Waiver is freely and voluntarily given with the understanding that my right (or any legal minors) to legal action against [place] relating to [employer] is knowingly given up to use these services.   I attest that I read the Ride Waiver and Release of Liability to Diana Goodman, gave Diana Goodman the opportunity to ask questions and answered the questions asked (if any). I affirm that Diana Goodman then provided consent for assistance with transportation.

## 2024-06-23 ENCOUNTER — Ambulatory Visit: Admitting: Obstetrics and Gynecology

## 2024-10-12 ENCOUNTER — Inpatient Hospital Stay: Admission: RE | Admit: 2024-10-12 | Payer: MEDICAID | Source: Ambulatory Visit

## 2024-10-26 ENCOUNTER — Ambulatory Visit: Payer: MEDICAID | Admitting: Obstetrics and Gynecology

## 2024-11-05 ENCOUNTER — Other Ambulatory Visit: Payer: MEDICAID

## 2024-11-09 ENCOUNTER — Other Ambulatory Visit: Payer: MEDICAID
